# Patient Record
Sex: Female | Born: 1945 | Race: White | Hispanic: No | Marital: Married | Smoking: Former smoker
Health system: Southern US, Community
[De-identification: ages and names within clinical notes are randomized; demographics above are authoritative.]

## PROBLEM LIST (undated history)

## (undated) DIAGNOSIS — IMO0001 Reserved for inherently not codable concepts without codable children: Secondary | ICD-10-CM

## (undated) DIAGNOSIS — J449 Chronic obstructive pulmonary disease, unspecified: Secondary | ICD-10-CM

## (undated) DIAGNOSIS — I10 Essential (primary) hypertension: Secondary | ICD-10-CM

## (undated) DIAGNOSIS — K219 Gastro-esophageal reflux disease without esophagitis: Secondary | ICD-10-CM

## (undated) DIAGNOSIS — E119 Type 2 diabetes mellitus without complications: Secondary | ICD-10-CM

## (undated) DIAGNOSIS — M109 Gout, unspecified: Secondary | ICD-10-CM

## (undated) DIAGNOSIS — D649 Anemia, unspecified: Secondary | ICD-10-CM

## (undated) HISTORY — DX: Anemia, unspecified: D64.9

## (undated) HISTORY — PX: ABDOMINAL HYSTERECTOMY: SHX81

## (undated) HISTORY — PX: EXPLORATION POST OPERATIVE OPEN HEART: SHX5061

## (undated) HISTORY — DX: Essential (primary) hypertension: I10

## (undated) HISTORY — DX: Gout, unspecified: M10.9

## (undated) HISTORY — DX: Reserved for inherently not codable concepts without codable children: IMO0001

## (undated) HISTORY — DX: Chronic obstructive pulmonary disease, unspecified: J44.9

## (undated) HISTORY — DX: Type 2 diabetes mellitus without complications: E11.9

## (undated) HISTORY — DX: Gastro-esophageal reflux disease without esophagitis: K21.9

---

## 2004-08-13 ENCOUNTER — Ambulatory Visit: Payer: Self-pay | Admitting: Family Medicine

## 2004-08-24 ENCOUNTER — Emergency Department: Payer: Self-pay | Admitting: Unknown Physician Specialty

## 2008-11-02 ENCOUNTER — Encounter: Payer: Self-pay | Admitting: Specialist

## 2008-11-10 ENCOUNTER — Encounter: Payer: Self-pay | Admitting: Specialist

## 2008-12-11 ENCOUNTER — Encounter: Payer: Self-pay | Admitting: Specialist

## 2010-02-05 ENCOUNTER — Inpatient Hospital Stay: Payer: Self-pay | Admitting: Internal Medicine

## 2010-06-20 ENCOUNTER — Ambulatory Visit: Payer: Self-pay | Admitting: Anesthesiology

## 2010-06-25 ENCOUNTER — Ambulatory Visit: Payer: Self-pay | Admitting: Anesthesiology

## 2011-01-27 ENCOUNTER — Emergency Department: Payer: Self-pay | Admitting: Emergency Medicine

## 2011-01-29 ENCOUNTER — Inpatient Hospital Stay: Payer: Self-pay | Admitting: Internal Medicine

## 2011-09-11 ENCOUNTER — Emergency Department: Payer: Self-pay | Admitting: Unknown Physician Specialty

## 2011-11-02 IMAGING — CR DG CHEST 2V
1 series · 3 of 3 positions shown · non-contrast
Comparison: none

REASON FOR EXAM: sob
COMMENTS:

PROCEDURE:     DXR - DXR CHEST PA (OR AP) AND LATERAL  - February 05, 2010  [DATE]
RESULT:     A left pleural effusion is noted. The patient has had prior
CABG. There is cardiomegaly with pulmonary venous congestion. A component of
congestive heart failure should be considered.

[Series 1: view not recorded · 0.17mm/px · 3 of 3 slices shown]
[im 1/3]
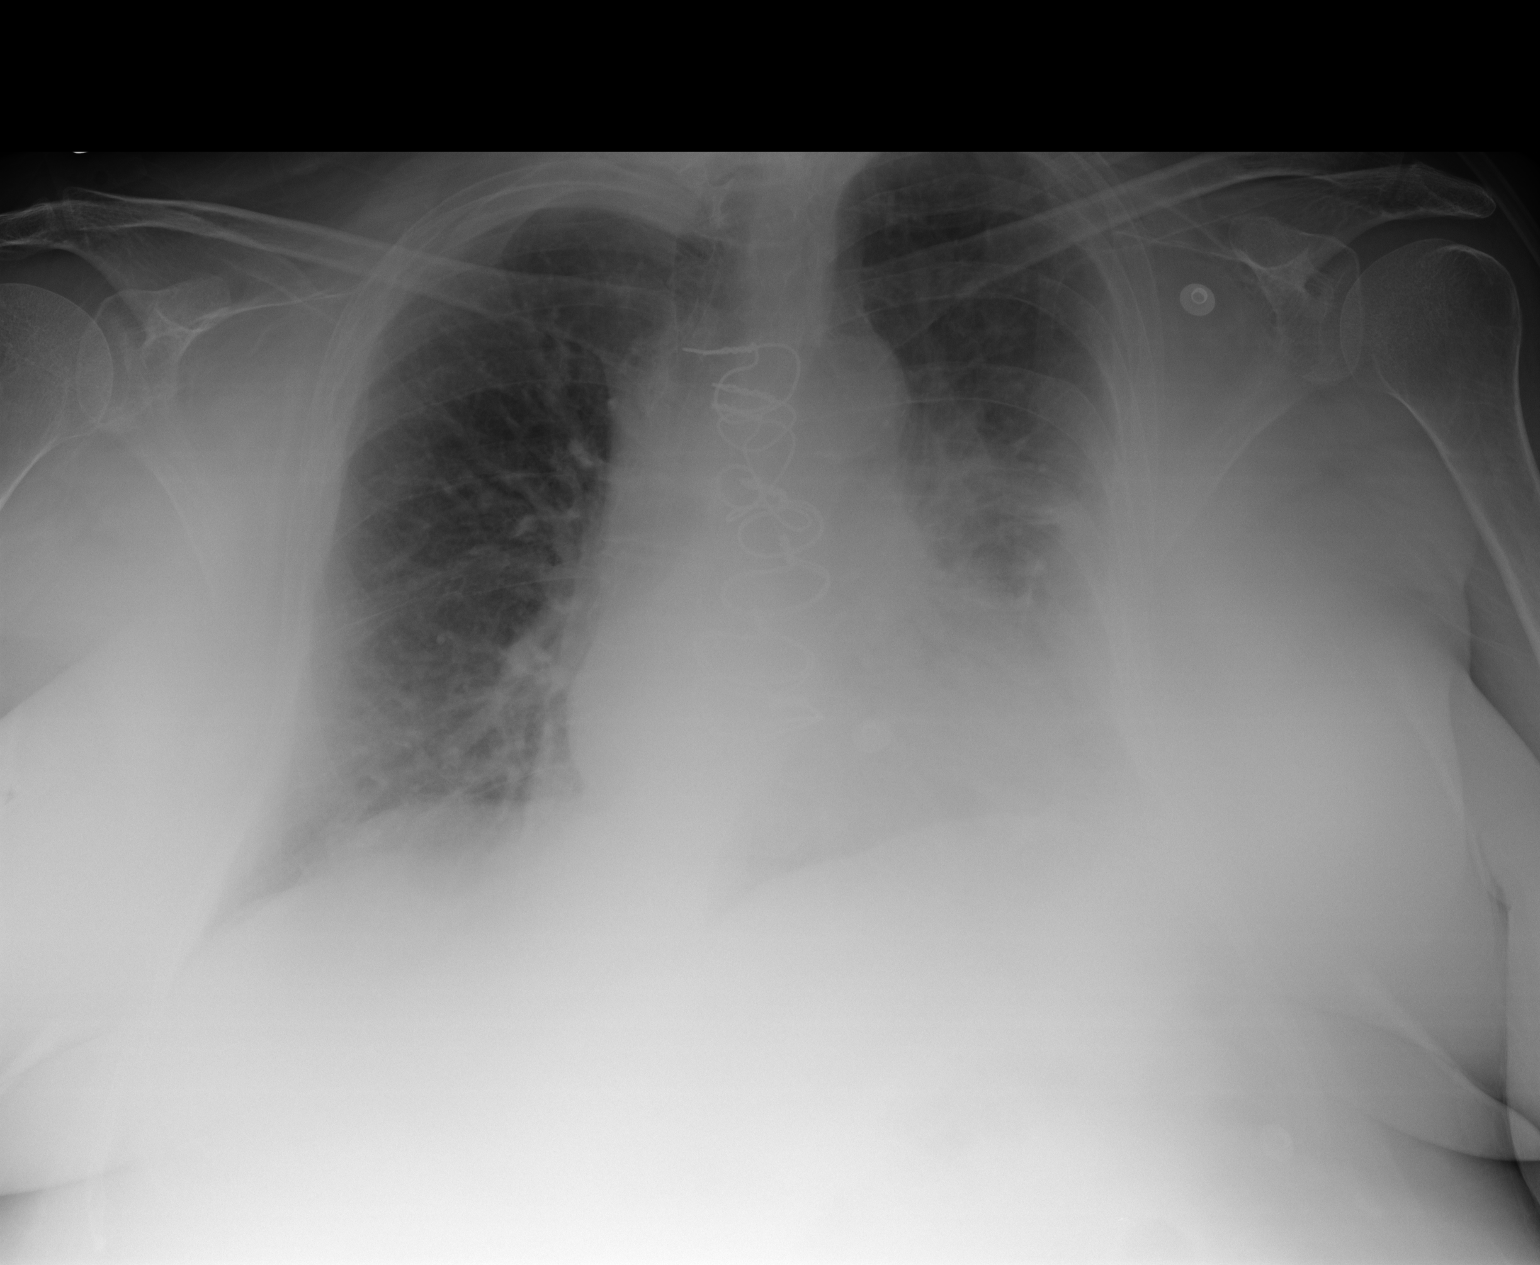
[im 2/3]
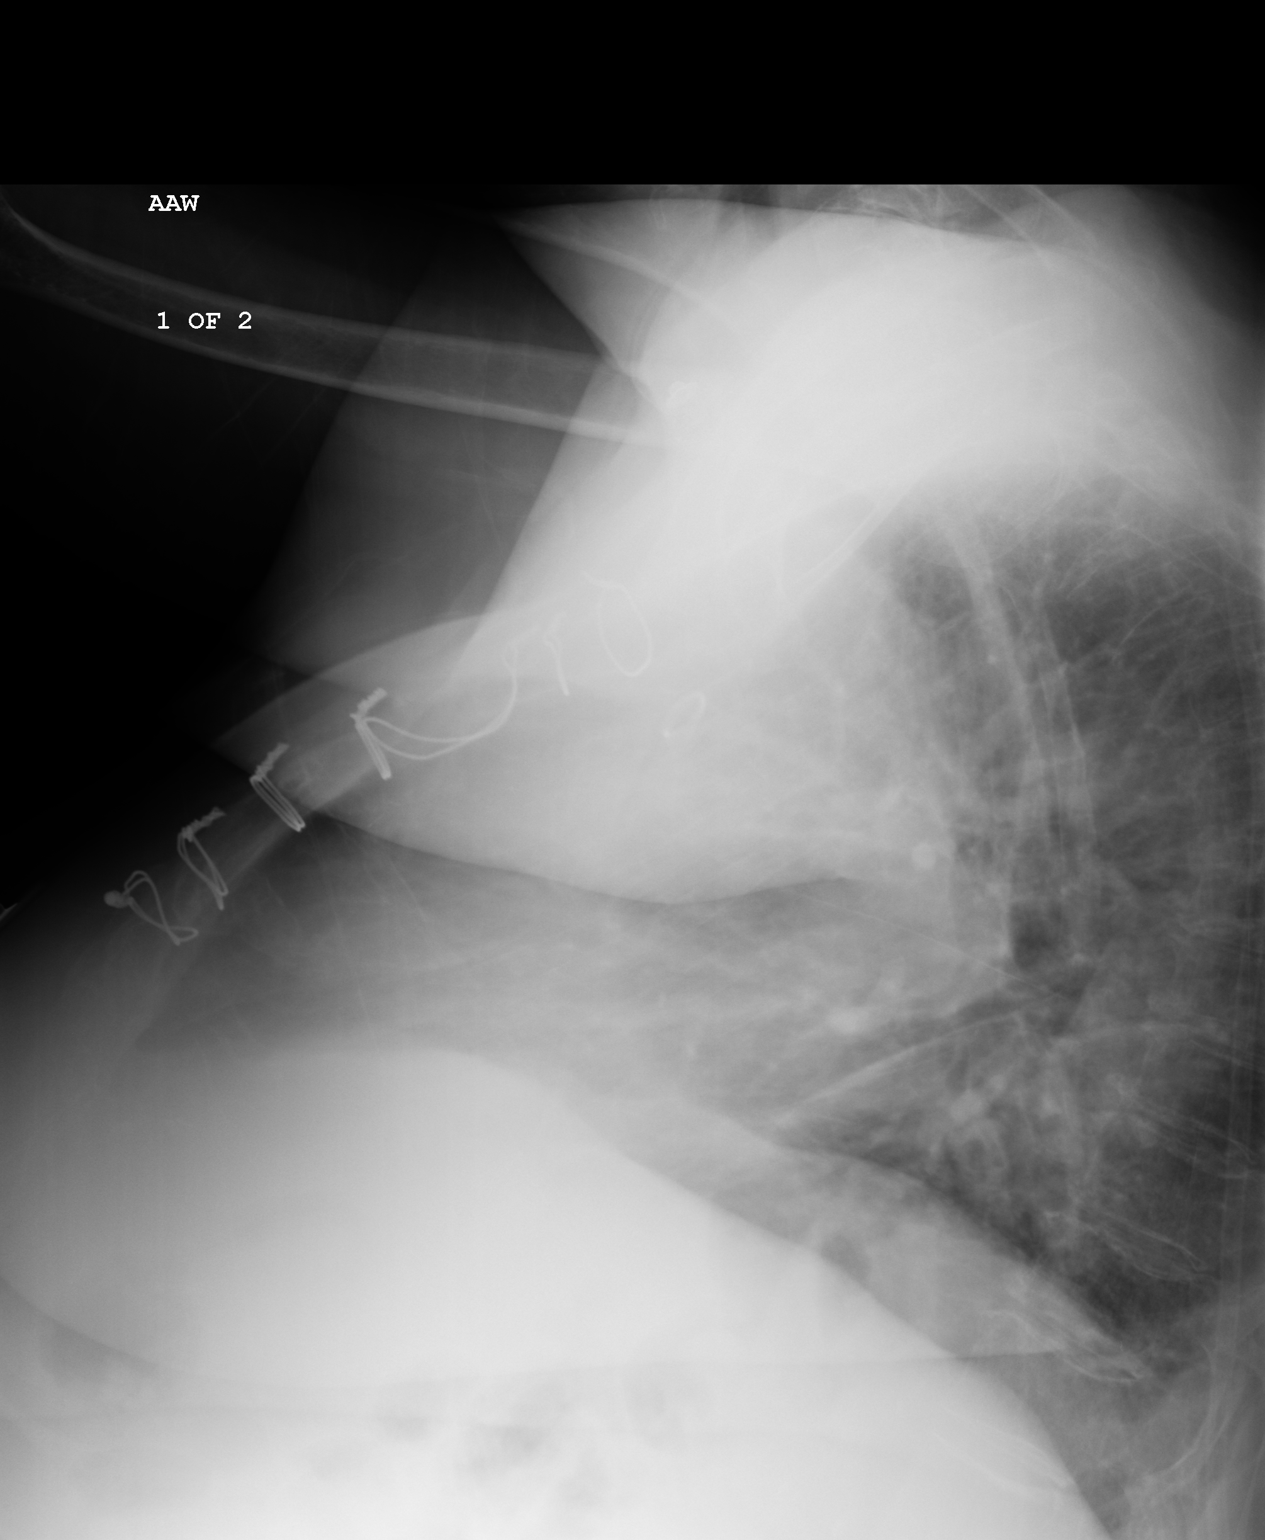
[im 3/3]
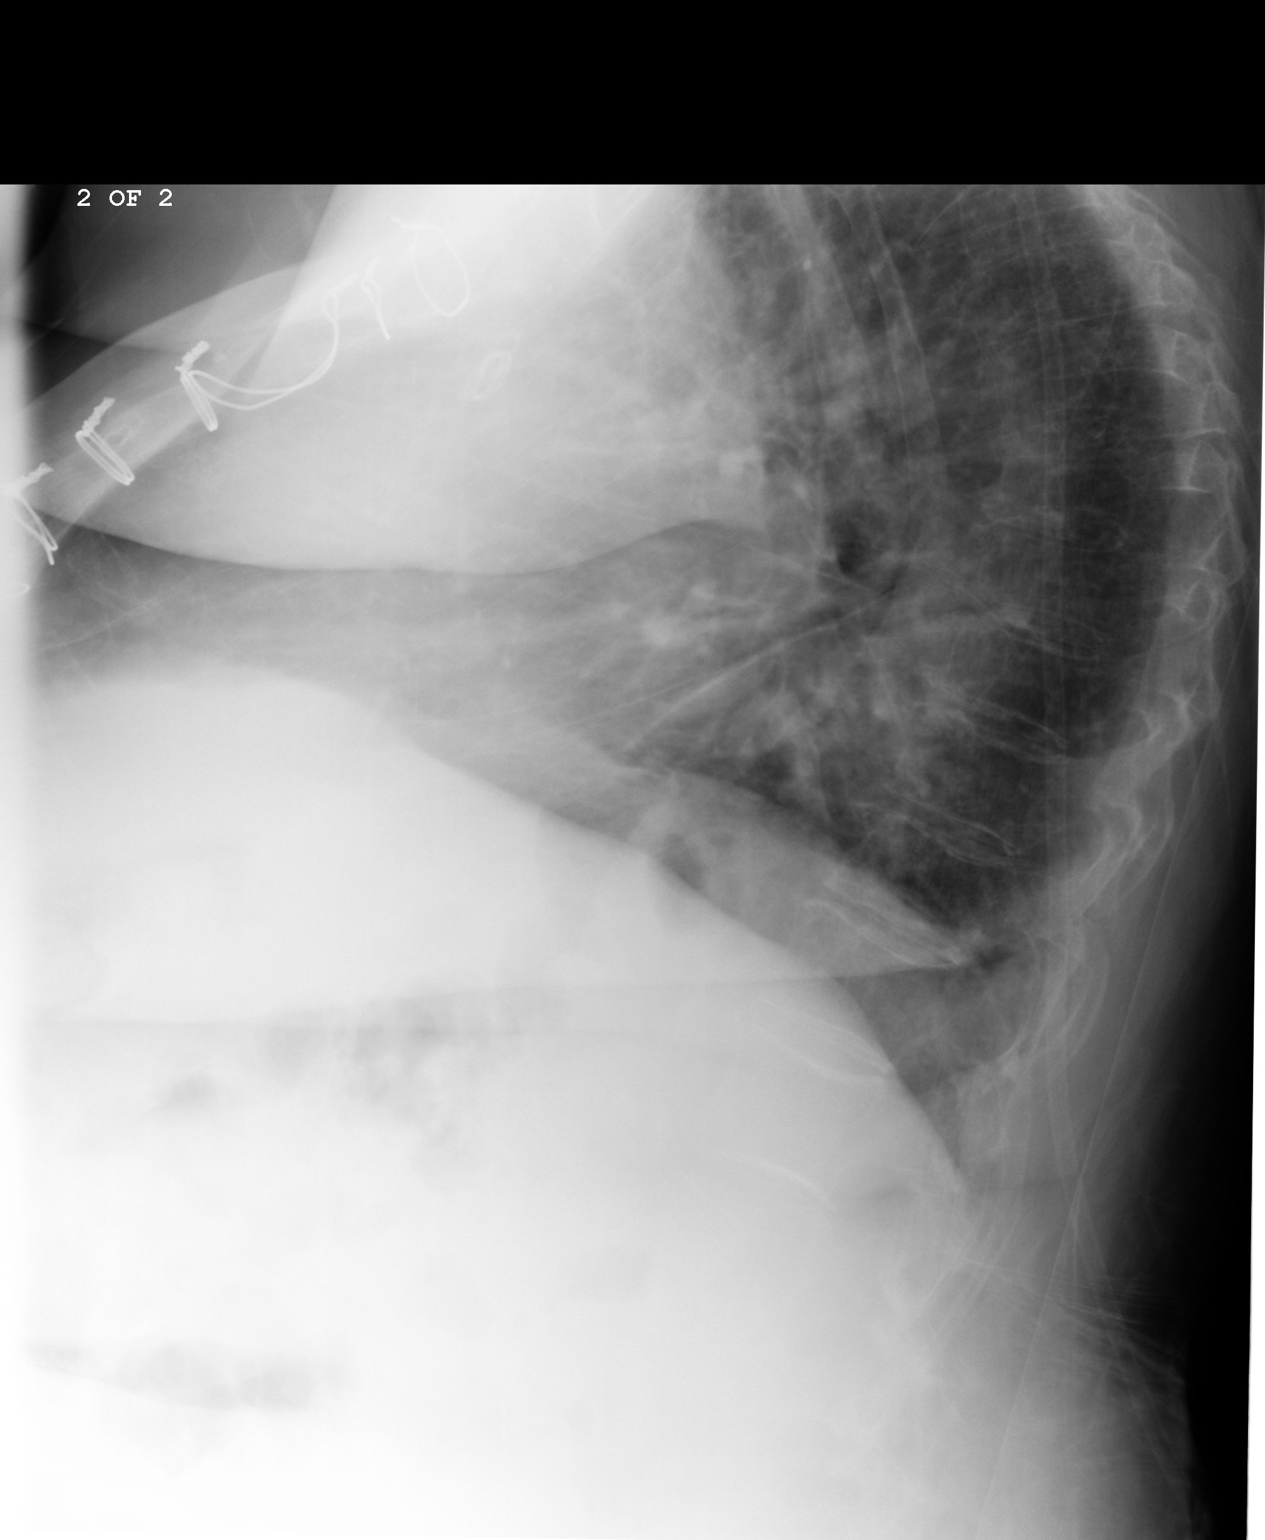

[3 of 3 positions shown; findings below may reference images not displayed]

IMPRESSION: Findings consistent with congestive heart failure, pulmonary
interstitial edema and left pleural effusion. The patient has had prior
CABG.

## 2012-05-20 ENCOUNTER — Encounter: Payer: Self-pay | Admitting: Internal Medicine

## 2012-07-04 ENCOUNTER — Inpatient Hospital Stay: Payer: Self-pay | Admitting: Internal Medicine

## 2012-07-04 LAB — CBC WITH DIFFERENTIAL/PLATELET
Eosinophil #: 0.3 10*3/uL (ref 0.0–0.7)
HCT: 29.9 % — ABNORMAL LOW (ref 35.0–47.0)
MCH: 28 pg (ref 26.0–34.0)
MCV: 89 fL (ref 80–100)
Monocyte %: 8.3 %
Neutrophil #: 7.9 10*3/uL — ABNORMAL HIGH (ref 1.4–6.5)
Neutrophil %: 81.1 %
Platelet: 289 10*3/uL (ref 150–440)
RBC: 3.37 10*6/uL — ABNORMAL LOW (ref 3.80–5.20)
RDW: 17.4 % — ABNORMAL HIGH (ref 11.5–14.5)
WBC: 9.7 10*3/uL (ref 3.6–11.0)

## 2012-07-04 LAB — BASIC METABOLIC PANEL
Anion Gap: 8 (ref 7–16)
BUN: 20 mg/dL — ABNORMAL HIGH (ref 7–18)
Chloride: 102 mmol/L (ref 98–107)
Co2: 29 mmol/L (ref 21–32)
Creatinine: 0.86 mg/dL (ref 0.60–1.30)
EGFR (African American): >60
EGFR (Non-African Amer.): >60
Glucose: 183 mg/dL — ABNORMAL HIGH (ref 65–99)
Osmolality: 285 (ref 275–301)
Sodium: 139 mmol/L (ref 136–145)

## 2012-07-04 LAB — URINALYSIS, COMPLETE
Bilirubin,UR: NEGATIVE
Blood: NEGATIVE
Ketone: NEGATIVE
Nitrite: NEGATIVE
Ph: 7 (ref 4.5–8.0)
RBC,UR: <1 /HPF (ref 0–5)
Squamous Epithelial: <1

## 2012-07-04 LAB — PRO B NATRIURETIC PEPTIDE: B-Type Natriuretic Peptide: 1137 pg/mL — ABNORMAL HIGH (ref 0–125)

## 2012-07-04 LAB — TROPONIN I: Troponin-I: 0.12 ng/mL — ABNORMAL HIGH

## 2012-07-05 DIAGNOSIS — I10 Essential (primary) hypertension: Secondary | ICD-10-CM

## 2012-07-05 DIAGNOSIS — I359 Nonrheumatic aortic valve disorder, unspecified: Secondary | ICD-10-CM

## 2012-07-05 DIAGNOSIS — R0602 Shortness of breath: Secondary | ICD-10-CM

## 2012-07-05 LAB — CBC WITH DIFFERENTIAL/PLATELET
Basophil #: 0.1 10*3/uL (ref 0.0–0.1)
Basophil %: 1.2 %
HCT: 29.5 % — ABNORMAL LOW (ref 35.0–47.0)
Monocyte %: 11.5 %
Neutrophil %: 67.3 %
Platelet: 292 10*3/uL (ref 150–440)
RBC: 3.38 10*6/uL — ABNORMAL LOW (ref 3.80–5.20)
RDW: 17.4 % — ABNORMAL HIGH (ref 11.5–14.5)
WBC: 6.3 10*3/uL (ref 3.6–11.0)

## 2012-07-05 LAB — COMPREHENSIVE METABOLIC PANEL
Albumin: 3.3 g/dL — ABNORMAL LOW (ref 3.4–5.0)
BUN: 26 mg/dL — ABNORMAL HIGH (ref 7–18)
Creatinine: 1.22 mg/dL (ref 0.60–1.30)
Glucose: 164 mg/dL — ABNORMAL HIGH (ref 65–99)
Potassium: 3.6 mmol/L (ref 3.5–5.1)
SGPT (ALT): 15 U/L (ref 12–78)

## 2012-07-06 LAB — BASIC METABOLIC PANEL
Anion Gap: 9 (ref 7–16)
Calcium, Total: 9.9 mg/dL (ref 8.5–10.1)
Chloride: 91 mmol/L — ABNORMAL LOW (ref 98–107)
Co2: 31 mmol/L (ref 21–32)
Glucose: 289 mg/dL — ABNORMAL HIGH (ref 65–99)
Osmolality: 283 (ref 275–301)

## 2012-07-07 DIAGNOSIS — I509 Heart failure, unspecified: Secondary | ICD-10-CM

## 2012-07-07 DIAGNOSIS — I359 Nonrheumatic aortic valve disorder, unspecified: Secondary | ICD-10-CM

## 2012-07-07 LAB — BASIC METABOLIC PANEL
Anion Gap: 6 — ABNORMAL LOW (ref 7–16)
BUN: 66 mg/dL — ABNORMAL HIGH (ref 7–18)
Calcium, Total: 9 mg/dL (ref 8.5–10.1)
Chloride: 91 mmol/L — ABNORMAL LOW (ref 98–107)
Creatinine: 2.04 mg/dL — ABNORMAL HIGH (ref 0.60–1.30)
Creatinine: 1.99 mg/dL — ABNORMAL HIGH (ref 0.60–1.30)
EGFR (Non-African Amer.): 25 — ABNORMAL LOW
Osmolality: 284 (ref 275–301)
Osmolality: 292 (ref 275–301)

## 2012-07-07 LAB — CBC WITH DIFFERENTIAL/PLATELET
Basophil #: 0 10*3/uL (ref 0.0–0.1)
Basophil %: 0.4 %
Eosinophil #: 0 10*3/uL (ref 0.0–0.7)
Eosinophil %: 0 %
HCT: 28.8 % — ABNORMAL LOW (ref 35.0–47.0)
Lymphocyte #: 0.6 10*3/uL — ABNORMAL LOW (ref 1.0–3.6)
MCH: 28.1 pg (ref 26.0–34.0)
MCHC: 32.4 g/dL (ref 32.0–36.0)
MCV: 87 fL (ref 80–100)
Monocyte #: 0.6 x10 3/mm (ref 0.2–0.9)
Neutrophil #: 9.3 10*3/uL — ABNORMAL HIGH (ref 1.4–6.5)
Neutrophil %: 87.9 %
RBC: 3.32 10*6/uL — ABNORMAL LOW (ref 3.80–5.20)
RDW: 17.7 % — ABNORMAL HIGH (ref 11.5–14.5)
WBC: 10.6 10*3/uL (ref 3.6–11.0)

## 2012-07-08 LAB — BASIC METABOLIC PANEL
BUN: 59 mg/dL — ABNORMAL HIGH (ref 7–18)
EGFR (African American): 43 — ABNORMAL LOW
EGFR (Non-African Amer.): 37 — ABNORMAL LOW
Glucose: 183 mg/dL — ABNORMAL HIGH (ref 65–99)
Potassium: 4.6 mmol/L (ref 3.5–5.1)
Sodium: 138 mmol/L (ref 136–145)

## 2012-07-09 LAB — BASIC METABOLIC PANEL
Anion Gap: 2 — ABNORMAL LOW (ref 7–16)
BUN: 43 mg/dL — ABNORMAL HIGH (ref 7–18)
Chloride: 101 mmol/L (ref 98–107)
Co2: 34 mmol/L — ABNORMAL HIGH (ref 21–32)
Creatinine: 1.26 mg/dL (ref 0.60–1.30)
Glucose: 199 mg/dL — ABNORMAL HIGH (ref 65–99)
Potassium: 4.9 mmol/L (ref 3.5–5.1)

## 2012-07-09 LAB — CULTURE, BLOOD (SINGLE)

## 2012-07-16 ENCOUNTER — Encounter: Payer: Medicare Other | Admitting: Cardiovascular Disease

## 2012-10-23 IMAGING — CR DG KNEE 1-2V*R*
1 series · 2 of 2 positions shown · non-contrast
Comparison: none

REASON FOR EXAM: fall, landed on right knee - knee pain
COMMENTS:

PROCEDURE:     DXR - DXR KNEE RIGHT AP AND LATERAL  - January 27, 2011  [DATE]
RESULT:     There is no evidence of fracture, dislocation, or malalignment.

[Series 1: view not recorded · 0.17mm/px · 2 of 2 slices shown]
[im 1/2]
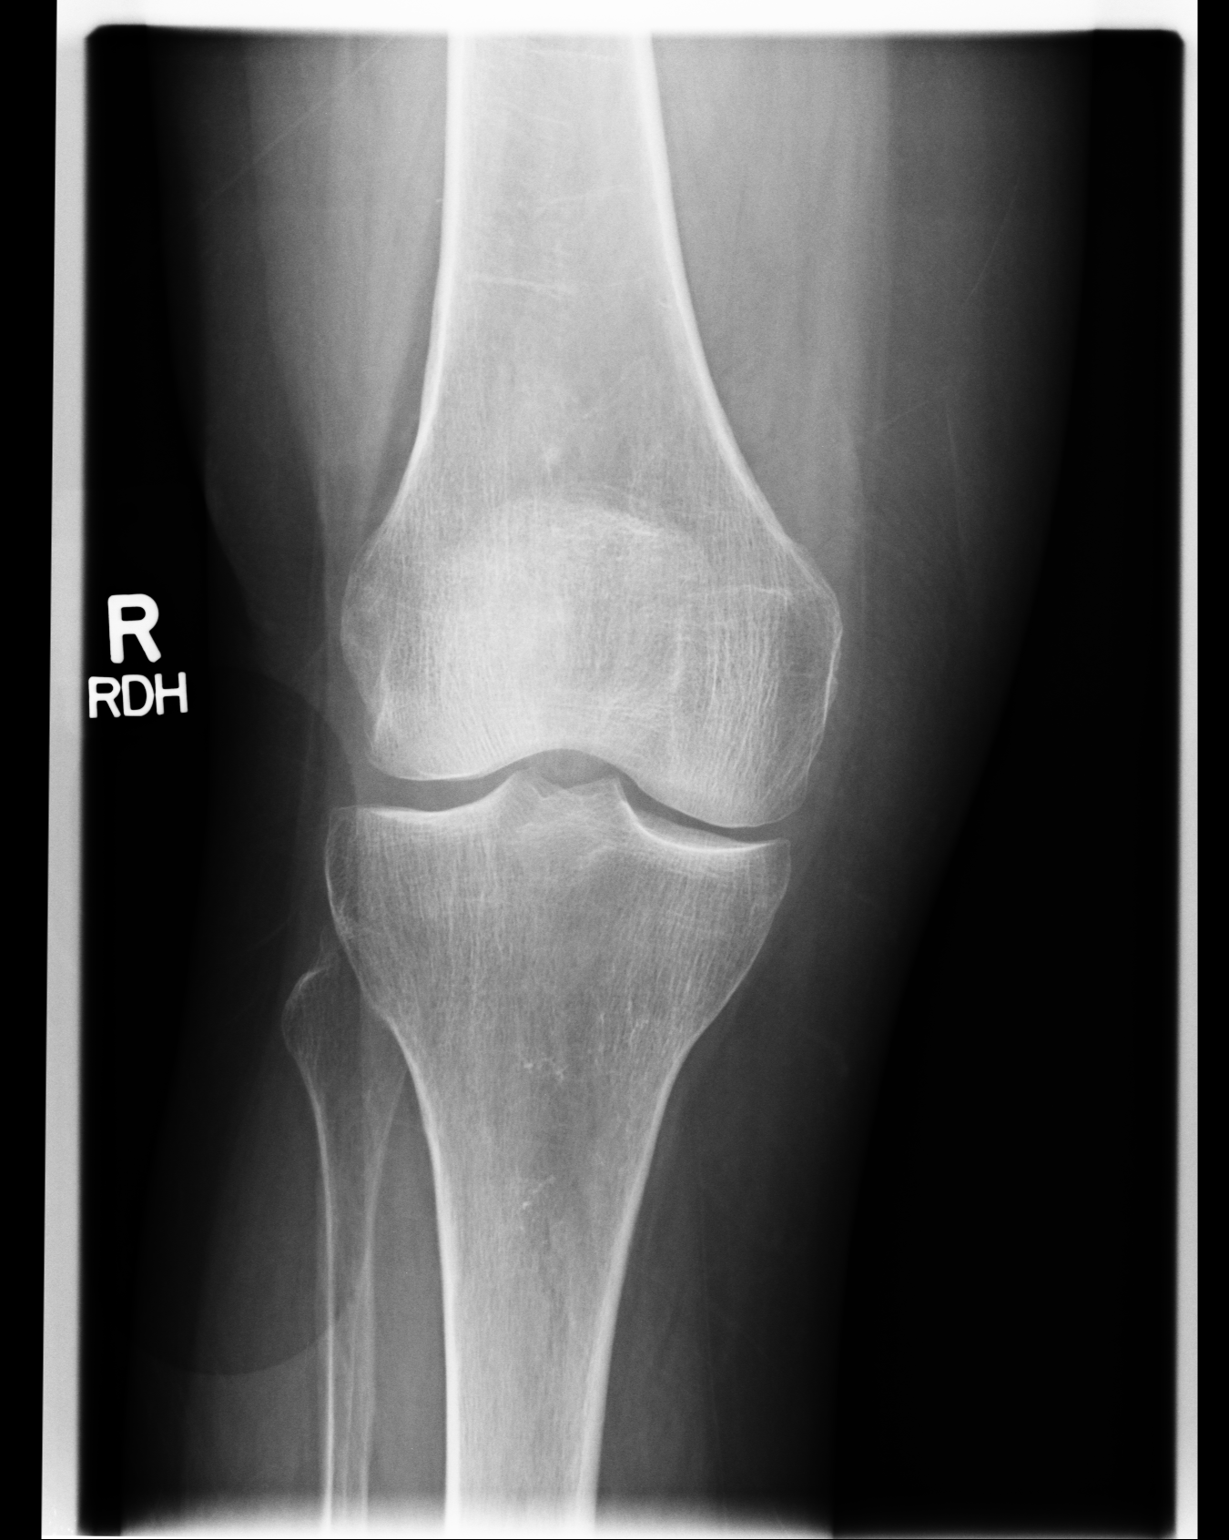
[im 2/2]
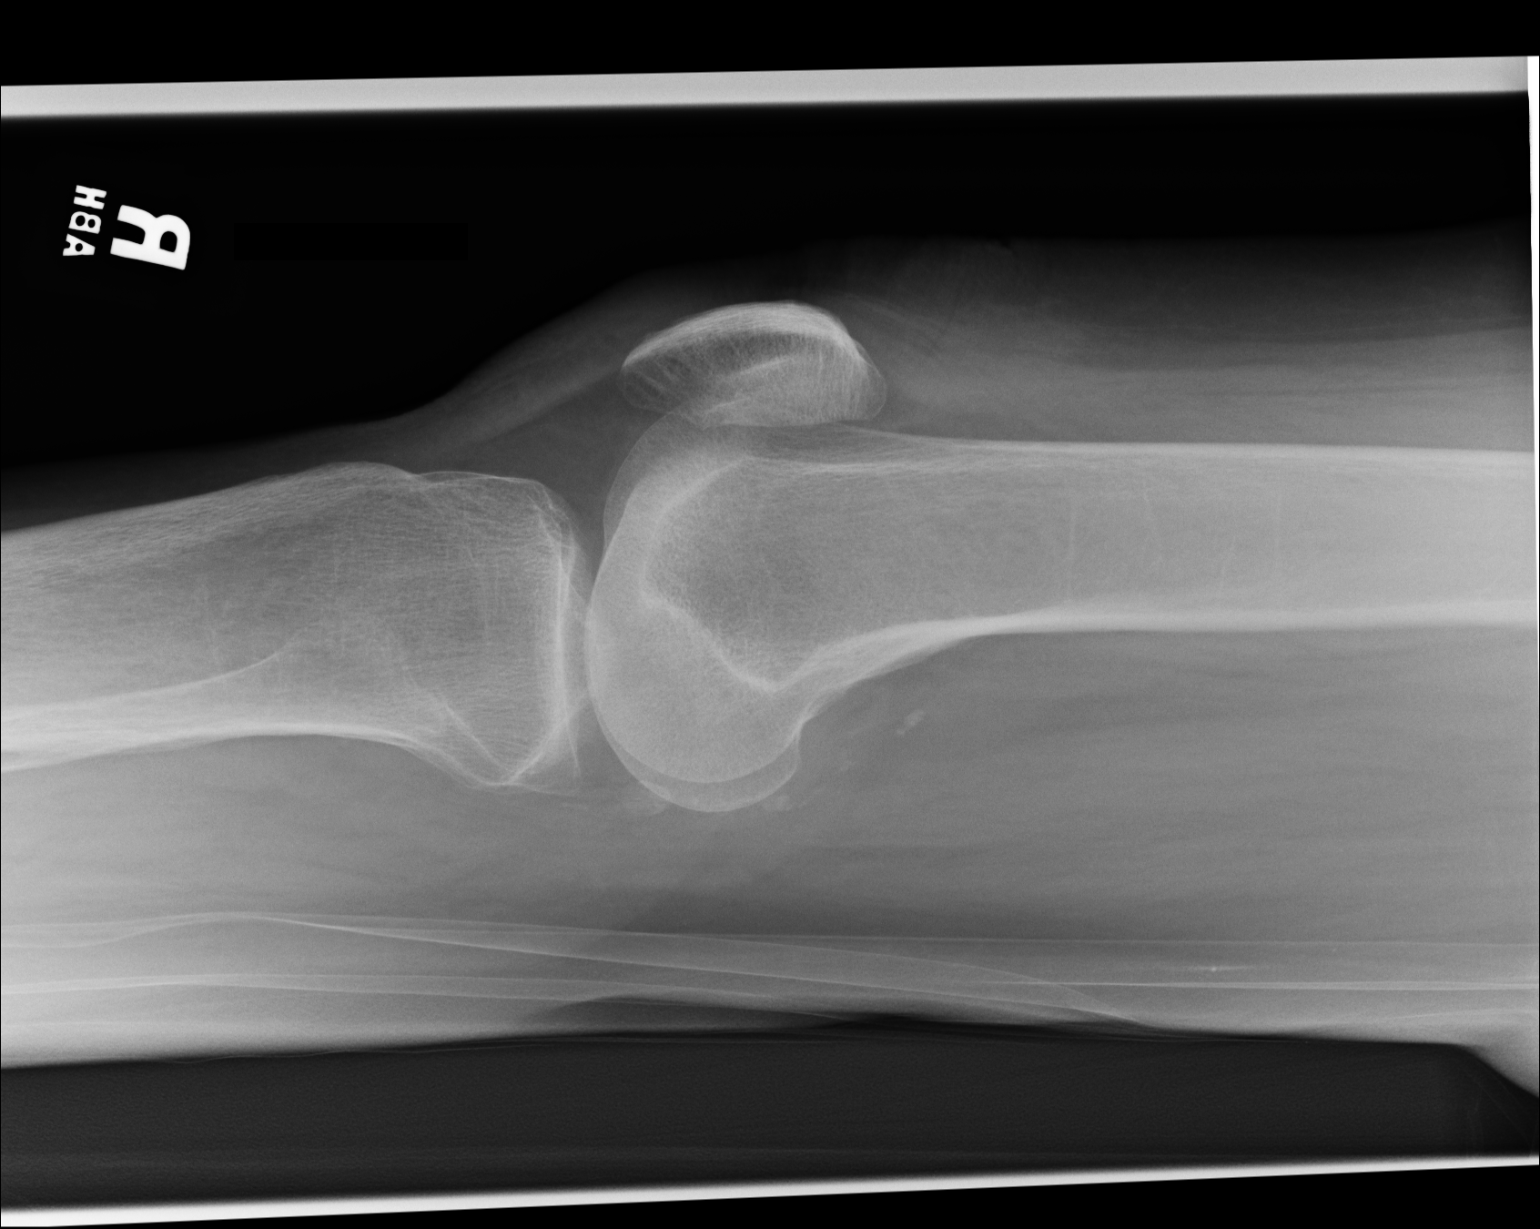

[2 of 2 positions shown; findings below may reference images not displayed]

IMPRESSION: 1. No evidence of acute abnormalities.
2. If there are persistent complaints of pain or persistent clinical
concern, a repeat evaluation in 7-10 days is recommended if clinically
warranted.

## 2013-05-07 ENCOUNTER — Encounter: Payer: Self-pay | Admitting: *Deleted

## 2013-05-10 ENCOUNTER — Encounter: Payer: Self-pay | Admitting: Podiatry

## 2013-05-10 ENCOUNTER — Ambulatory Visit (INDEPENDENT_AMBULATORY_CARE_PROVIDER_SITE_OTHER): Payer: Medicare Other | Admitting: Podiatry

## 2013-05-10 VITALS — BP 102/64 | HR 65 | Resp 20

## 2013-05-10 DIAGNOSIS — M79609 Pain in unspecified limb: Secondary | ICD-10-CM

## 2013-05-10 DIAGNOSIS — B351 Tinea unguium: Secondary | ICD-10-CM

## 2013-05-10 NOTE — Progress Notes (Signed)
She presents today with a chief complaint of painful toenails one through 5 bilateral.  Objective: Pulses are palpable bilateral. Nails are thick yellow dystrophic with mycotic and painful palpation.  Assessment: Pain in limb secondary to onychomycosis.  Plan: Debridement nails 1-5 bilateral covered service secondary to pain.

## 2013-08-09 ENCOUNTER — Ambulatory Visit: Payer: Medicare Other | Admitting: Podiatry

## 2013-08-09 LAB — COMPREHENSIVE METABOLIC PANEL
ALK PHOS: 160 U/L — AB
ALT: 17 U/L (ref 12–78)
ANION GAP: 2 — AB (ref 7–16)
AST: 23 U/L (ref 15–37)
Albumin: 3.4 g/dL (ref 3.4–5.0)
BILIRUBIN TOTAL: 0.4 mg/dL (ref 0.2–1.0)
BUN: 22 mg/dL — AB (ref 7–18)
CHLORIDE: 101 mmol/L (ref 98–107)
CREATININE: 1.29 mg/dL (ref 0.60–1.30)
Calcium, Total: 9.1 mg/dL (ref 8.5–10.1)
Co2: 31 mmol/L (ref 21–32)
EGFR (African American): 49 — ABNORMAL LOW
EGFR (Non-African Amer.): 43 — ABNORMAL LOW
Glucose: 102 mg/dL — ABNORMAL HIGH (ref 65–99)
Osmolality: 272 (ref 275–301)
POTASSIUM: 3.8 mmol/L (ref 3.5–5.1)
Sodium: 134 mmol/L — ABNORMAL LOW (ref 136–145)
Total Protein: 7.6 g/dL (ref 6.4–8.2)

## 2013-08-09 LAB — CBC WITH DIFFERENTIAL/PLATELET
BASOS ABS: 0 10*3/uL (ref 0.0–0.1)
Basophil %: 1.1 %
EOS ABS: 0.1 10*3/uL (ref 0.0–0.7)
EOS PCT: 1.4 %
HCT: 31.9 % — ABNORMAL LOW (ref 35.0–47.0)
HGB: 10.1 g/dL — ABNORMAL LOW (ref 12.0–16.0)
LYMPHS ABS: 0.5 10*3/uL — AB (ref 1.0–3.6)
LYMPHS PCT: 13.2 %
MCH: 27 pg (ref 26.0–34.0)
MCHC: 31.8 g/dL — AB (ref 32.0–36.0)
MCV: 85 fL (ref 80–100)
MONOS PCT: 19.8 %
Monocyte #: 0.8 x10 3/mm (ref 0.2–0.9)
NEUTROS ABS: 2.7 10*3/uL (ref 1.4–6.5)
Neutrophil %: 64.5 %
Platelet: 259 10*3/uL (ref 150–440)
RBC: 3.76 10*6/uL — ABNORMAL LOW (ref 3.80–5.20)
RDW: 16.6 % — ABNORMAL HIGH (ref 11.5–14.5)
WBC: 4.1 10*3/uL (ref 3.6–11.0)

## 2013-08-09 LAB — TROPONIN I
TROPONIN-I: 0.18 ng/mL — AB
Troponin-I: 0.19 ng/mL — ABNORMAL HIGH
Troponin-I: 0.16 ng/mL — ABNORMAL HIGH

## 2013-08-09 LAB — PRO B NATRIURETIC PEPTIDE: B-Type Natriuretic Peptide: 847 pg/mL — ABNORMAL HIGH (ref 0–125)

## 2013-08-10 ENCOUNTER — Inpatient Hospital Stay: Payer: Self-pay | Admitting: Internal Medicine

## 2013-08-10 LAB — CBC WITH DIFFERENTIAL/PLATELET
Basophil #: 0 10*3/uL (ref 0.0–0.1)
Basophil %: 0.2 %
EOS ABS: 0 10*3/uL (ref 0.0–0.7)
Eosinophil %: 0 %
HCT: 28.4 % — ABNORMAL LOW (ref 35.0–47.0)
HGB: 9.1 g/dL — ABNORMAL LOW (ref 12.0–16.0)
LYMPHS ABS: 0.2 10*3/uL — AB (ref 1.0–3.6)
LYMPHS PCT: 2.5 %
MCH: 27.2 pg (ref 26.0–34.0)
MCHC: 32.2 g/dL (ref 32.0–36.0)
MCV: 84 fL (ref 80–100)
Monocyte #: 0.4 x10 3/mm (ref 0.2–0.9)
Monocyte %: 5.5 %
Neutrophil #: 7.1 10*3/uL — ABNORMAL HIGH (ref 1.4–6.5)
Neutrophil %: 91.8 %
Platelet: 257 10*3/uL (ref 150–440)
RBC: 3.36 10*6/uL — AB (ref 3.80–5.20)
RDW: 16.4 % — AB (ref 11.5–14.5)
WBC: 7.8 10*3/uL (ref 3.6–11.0)

## 2013-08-10 LAB — BASIC METABOLIC PANEL
Anion Gap: 2 — ABNORMAL LOW (ref 7–16)
BUN: 22 mg/dL — AB (ref 7–18)
CALCIUM: 8.8 mg/dL (ref 8.5–10.1)
CO2: 30 mmol/L (ref 21–32)
Chloride: 102 mmol/L (ref 98–107)
Creatinine: 1.22 mg/dL (ref 0.60–1.30)
EGFR (African American): 53 — ABNORMAL LOW
EGFR (Non-African Amer.): 45 — ABNORMAL LOW
GLUCOSE: 111 mg/dL — AB (ref 65–99)
OSMOLALITY: 272 (ref 275–301)
Potassium: 4 mmol/L (ref 3.5–5.1)
SODIUM: 134 mmol/L — AB (ref 136–145)

## 2013-09-06 ENCOUNTER — Ambulatory Visit: Payer: Medicare Other | Admitting: Podiatry

## 2013-09-08 ENCOUNTER — Encounter: Payer: Self-pay | Admitting: Podiatry

## 2013-09-08 ENCOUNTER — Ambulatory Visit (INDEPENDENT_AMBULATORY_CARE_PROVIDER_SITE_OTHER): Payer: Medicare Other | Admitting: Podiatry

## 2013-09-08 VITALS — BP 118/68 | HR 72 | Resp 16

## 2013-09-08 DIAGNOSIS — M79609 Pain in unspecified limb: Secondary | ICD-10-CM

## 2013-09-08 DIAGNOSIS — B351 Tinea unguium: Secondary | ICD-10-CM

## 2013-09-08 NOTE — Progress Notes (Signed)
She presents today with a chief complaint of painful elongated toenails one through 5 bilateral.  Objective: Vital signs are stable alert oriented x3 pulses are palpable bilateral. Nails are thick yellow dystrophic with mycotic painful palpation and elongated.  Assessment: Pain in limb secondary to onychomycosis 1 through 5 bilateral.  Plan: Debridement of nails 1 through 5 bilateral covered service secondary to pain.

## 2013-12-08 ENCOUNTER — Ambulatory Visit (INDEPENDENT_AMBULATORY_CARE_PROVIDER_SITE_OTHER): Payer: Medicare Other | Admitting: Podiatry

## 2013-12-08 DIAGNOSIS — M79676 Pain in unspecified toe(s): Secondary | ICD-10-CM

## 2013-12-08 DIAGNOSIS — M79609 Pain in unspecified limb: Secondary | ICD-10-CM

## 2013-12-08 DIAGNOSIS — B351 Tinea unguium: Secondary | ICD-10-CM

## 2013-12-08 NOTE — Progress Notes (Signed)
She presents today chief complaint of painful elongated toenails.  Objective: Pulses are strongly palpable bilateral. Nails are thick yellow dystrophic with mycotic and painful palpation. Reactive hyperkeratosis sub-first metatarsophalangeal joint of the right foot does not appear to be ulcerated as of yet.  Assessment: Diabetes mellitus pre-ulcerative lesion sub-first metatarsophalangeal joint of the right foot. Pain in limb secondary to onychomycosis 1 through 5 bilateral.  Plan: Debridement of reactive hyperkeratosis without iatrogenic lesions and debridement of nails 1 through 5 bilateral covered service secondary to pain.

## 2014-03-09 ENCOUNTER — Ambulatory Visit (INDEPENDENT_AMBULATORY_CARE_PROVIDER_SITE_OTHER): Payer: Medicare Other | Admitting: Podiatry

## 2014-03-09 DIAGNOSIS — B351 Tinea unguium: Secondary | ICD-10-CM

## 2014-03-09 DIAGNOSIS — M79676 Pain in unspecified toe(s): Secondary | ICD-10-CM

## 2014-03-09 NOTE — Progress Notes (Signed)
Presents today chief complaint of painful elongated toenails.  Objective: Pulses are palpable bilateral nails are thick, yellow dystrophic onychomycosis and painful palpation.   Assessment: Onychomycosis with pain in limb.  Plan: Treatment of nails in thickness and length as covered service secondary to pain.  

## 2014-04-05 IMAGING — CR DG CHEST 2V
1 series · 3 of 3 positions shown · non-contrast
Comparison: none

REASON FOR EXAM: hypoxia
COMMENTS:

PROCEDURE:     DXR - DXR CHEST PA (OR AP) AND LATERAL  - July 09, 2012  [DATE]
RESULT:     Comparison: 07/06/2012

[Series 1: x chest ap · 0.14mm/px · 3 of 3 slices shown]
[im 1/3]
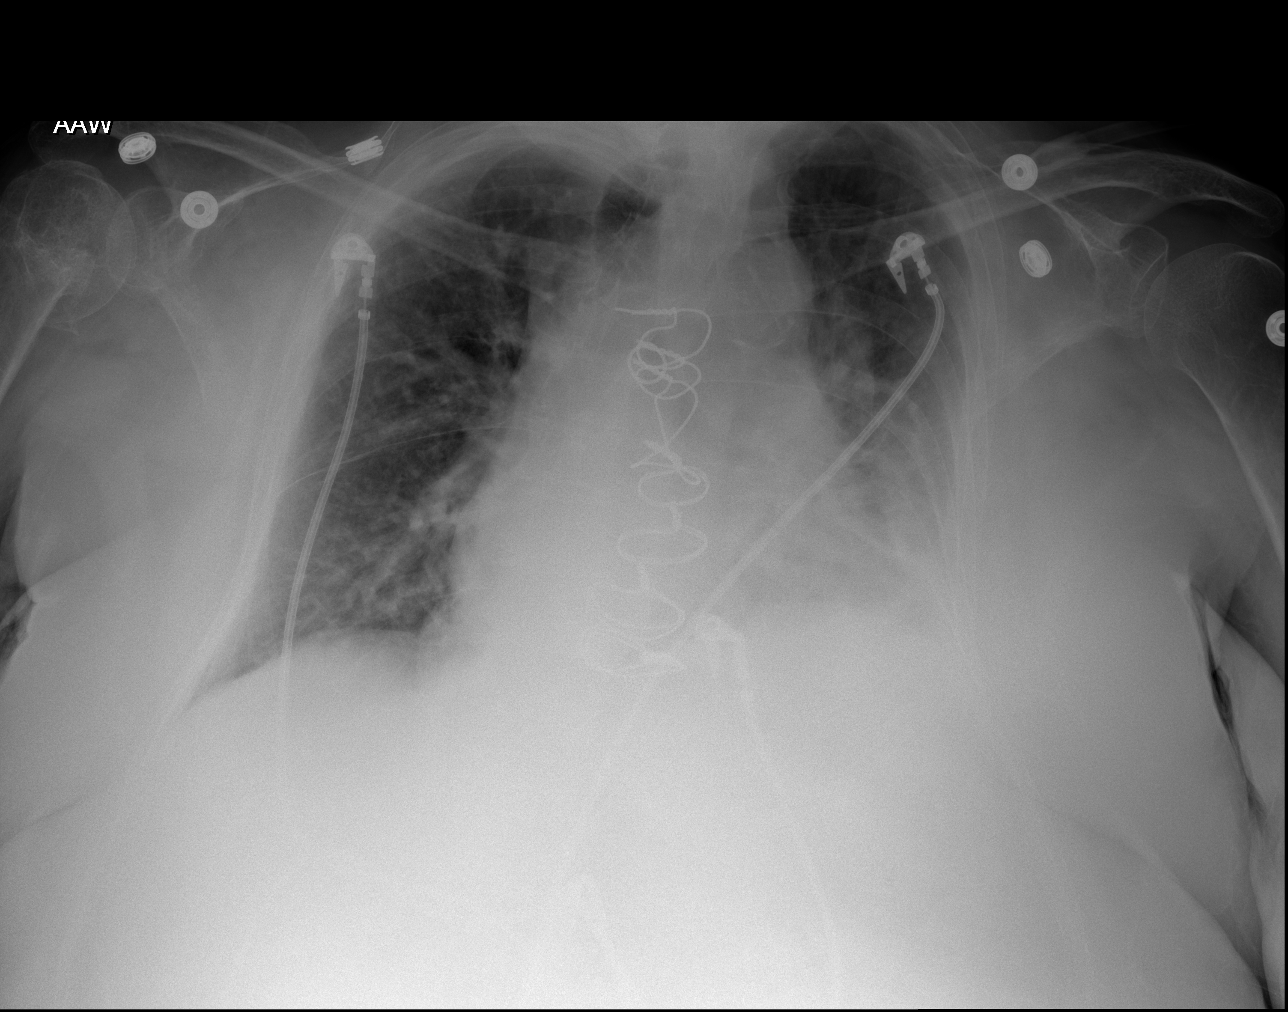
[im 2/3]
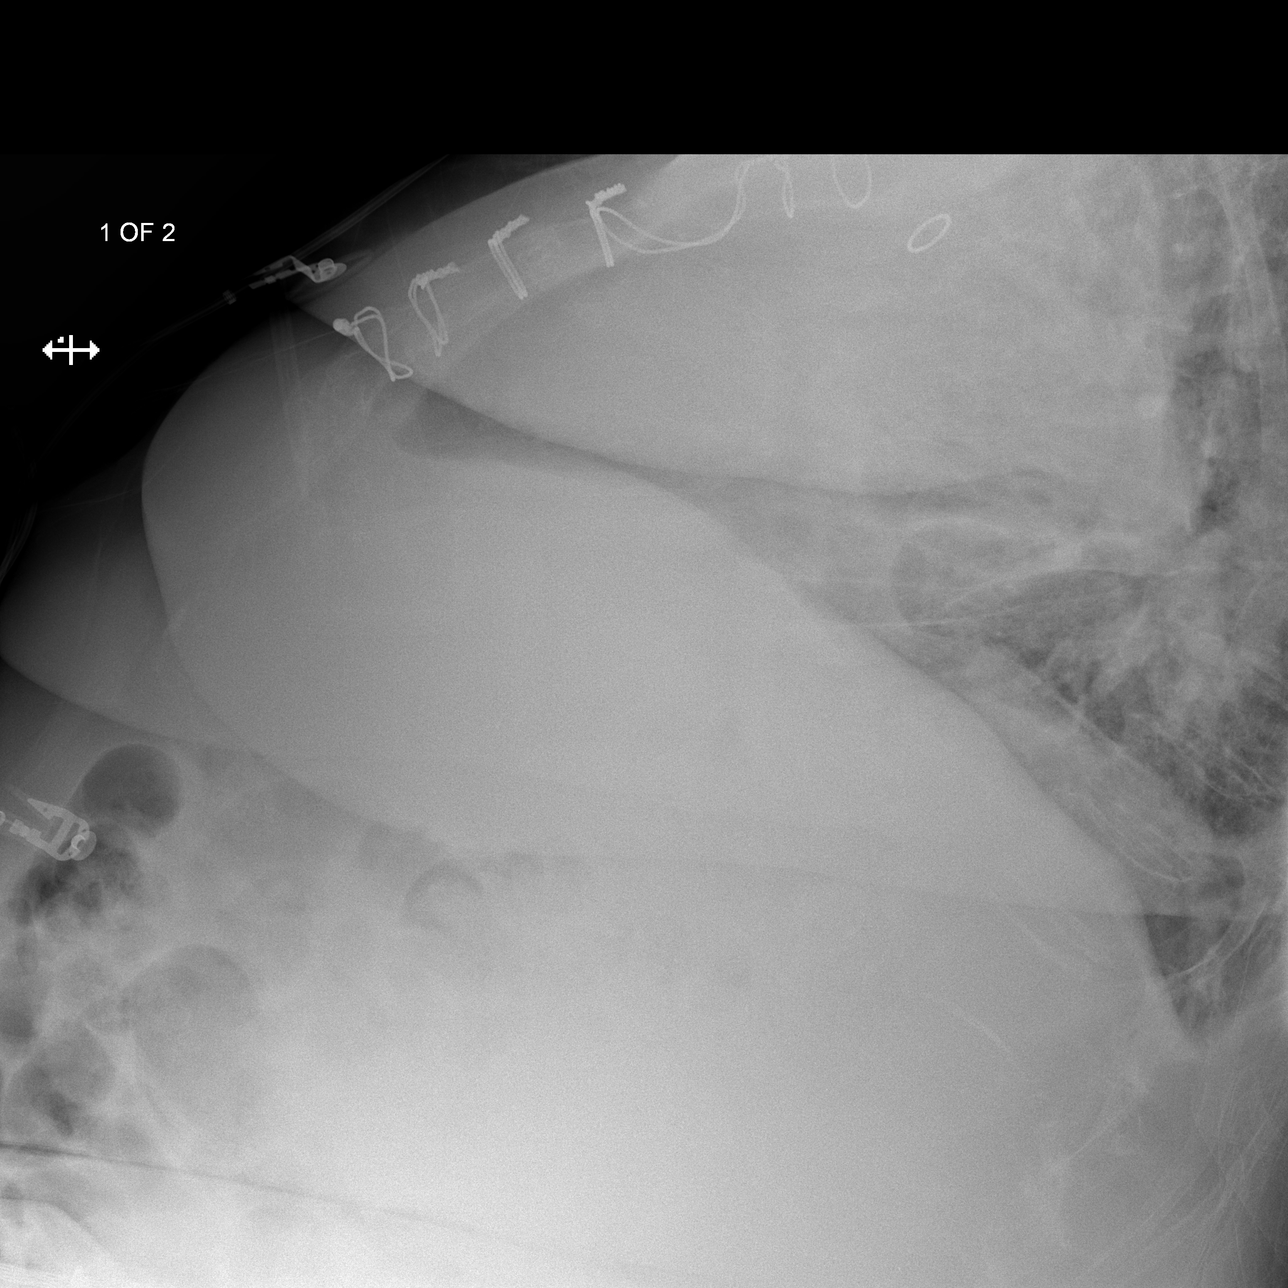
[im 3/3]
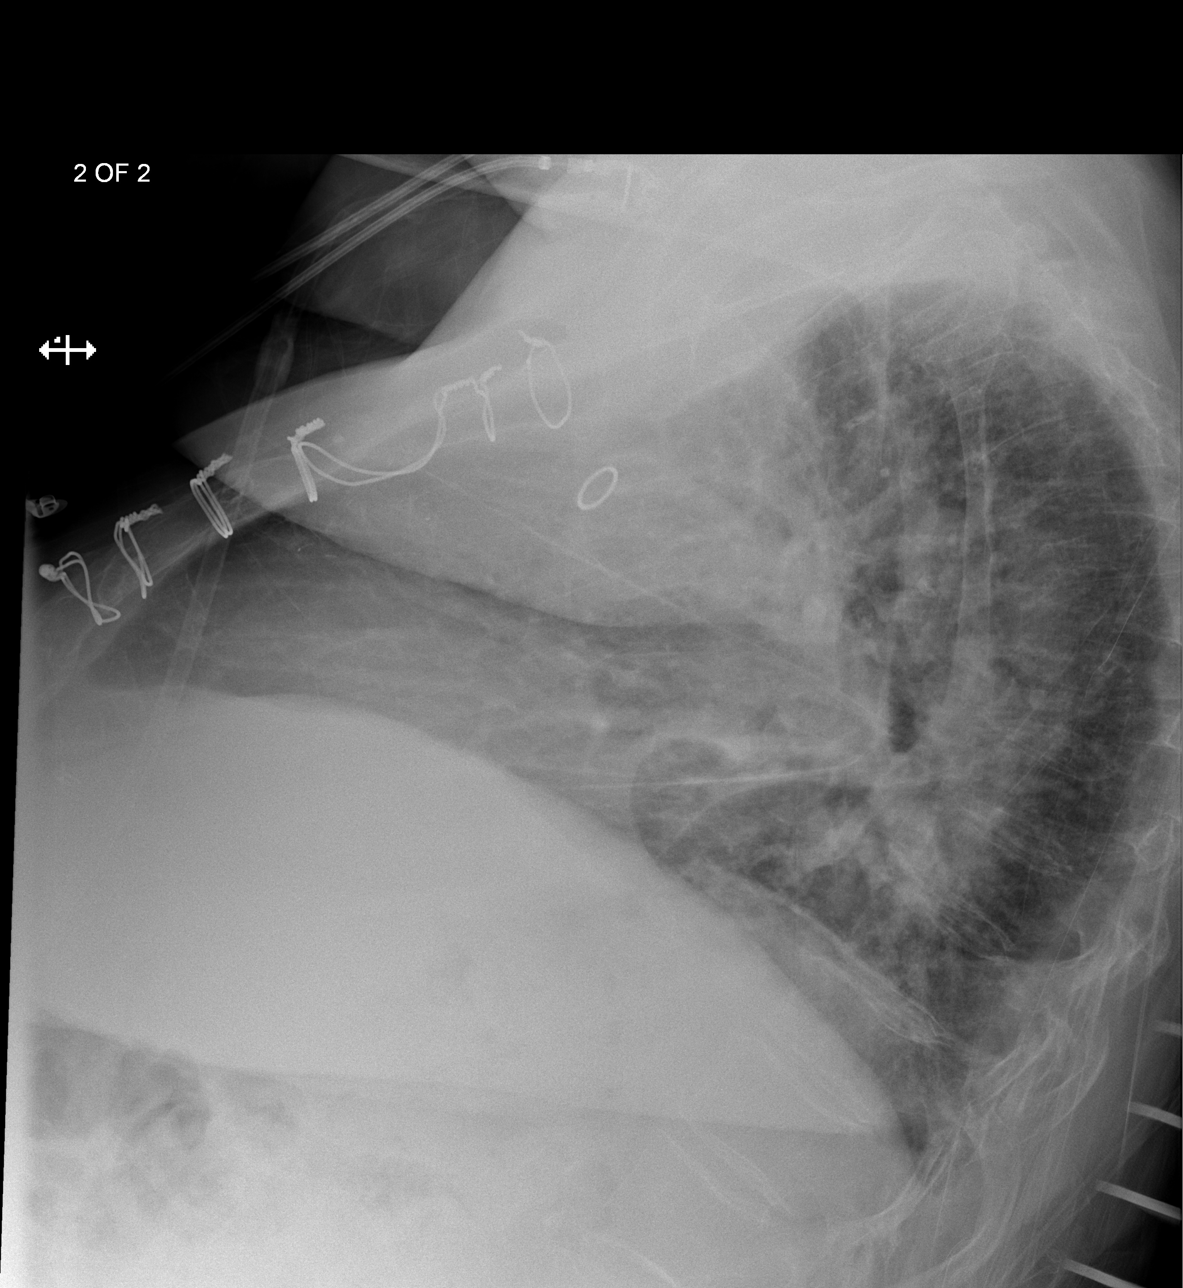

[3 of 3 positions shown; findings below may reference images not displayed]

FINDINGS: PA and lateral chest radiographs are provided. There is bilateral diffuse
interstitial thickening likely representing interstitial edema versus
interstitial pneumonitis secondary to an infectious or inflammatory
etiology. There is no focal parenchymal opacity, pleural effusion, or
pneumothorax. The heart and mediastinum are unremarkable. Prior CABG. The
osseous structures are unremarkable.
IMPRESSION: There is bilateral diffuse interstitial thickening likely representing
interstitial edema versus interstitial pneumonitis secondary to an
infectious or inflammatory etiology.

[REDACTED]

## 2014-04-05 IMAGING — CT CT CHEST W/O CM
1 series · 15 of 31 positions shown, 19 images · non-contrast
Comparison: none

REASON FOR EXAM: sob, hypoxia, suspect pulm fibrosis (cxr positive
interstial changes)
COMMENTS:

[Series 2: soft tissue · axial · 0.72mm/px · z∈[-320,-62]mm · 15 of 94 slices shown, 19 images]
[im 4/94  mediastinal]
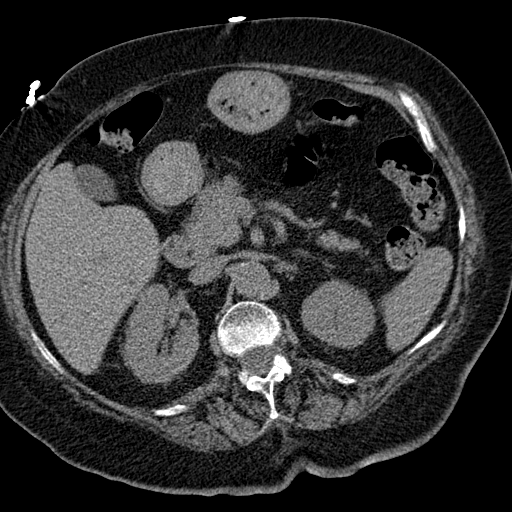
[im 4/94  lung]
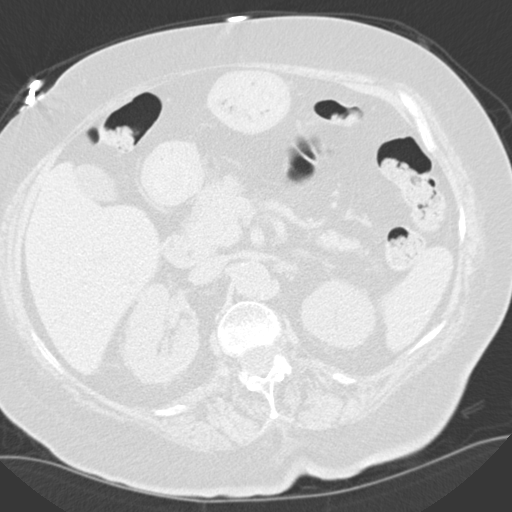
[im 11/94  lung]
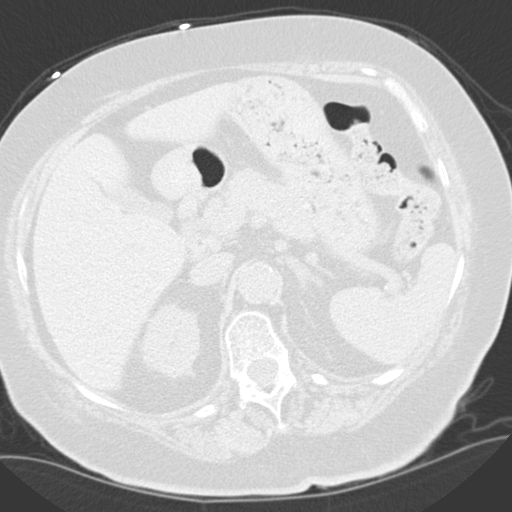
[im 18/94  lung]
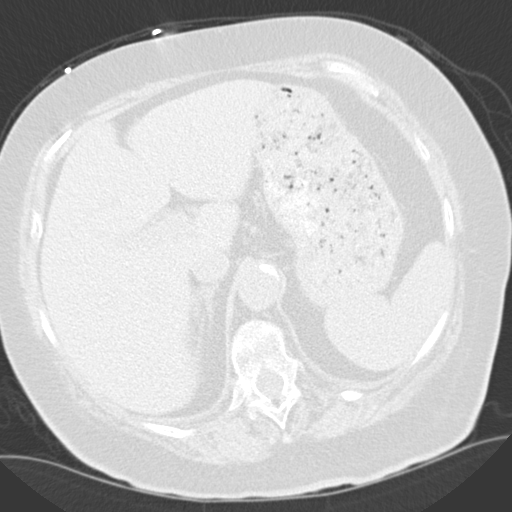
[im 21/94  lung]
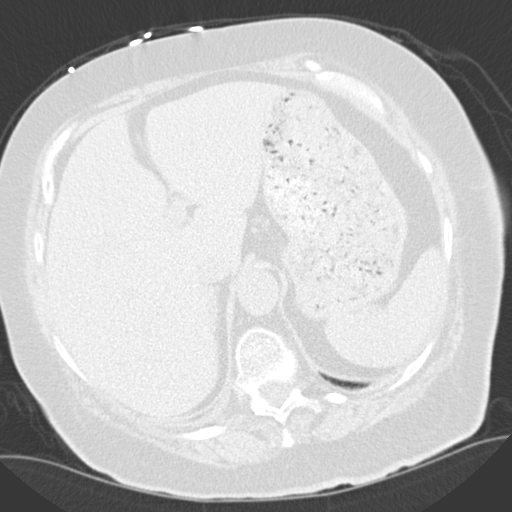
[im 28/94  mediastinal]
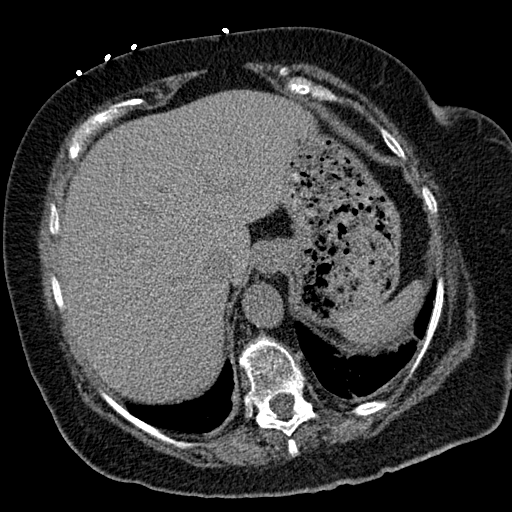
[im 28/94  lung]
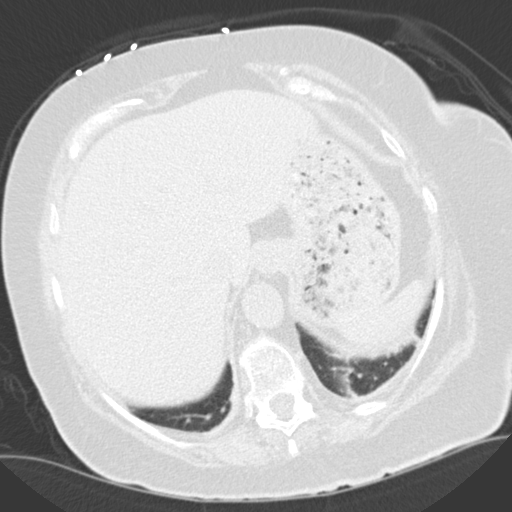
[im 35/94  lung]
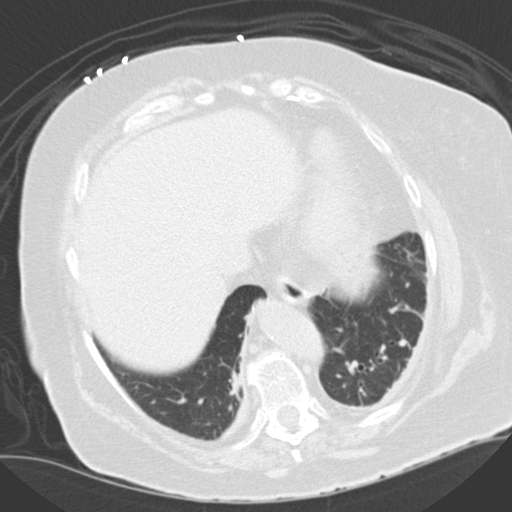
[im 42/94  lung]
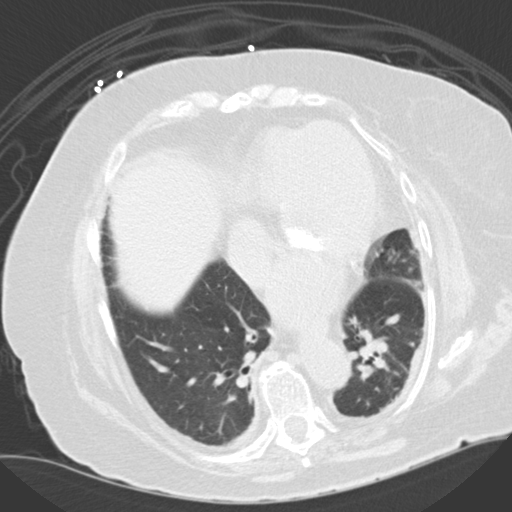
[im 49/94  lung]
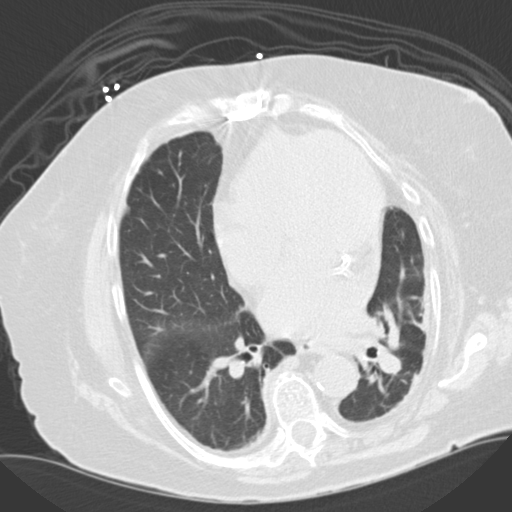
[im 52/94  mediastinal]
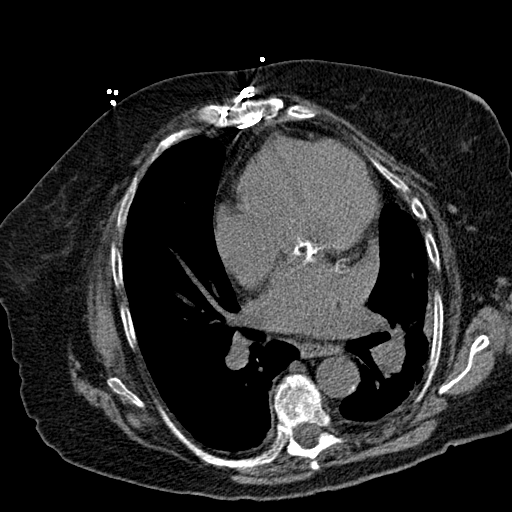
[im 52/94  lung]
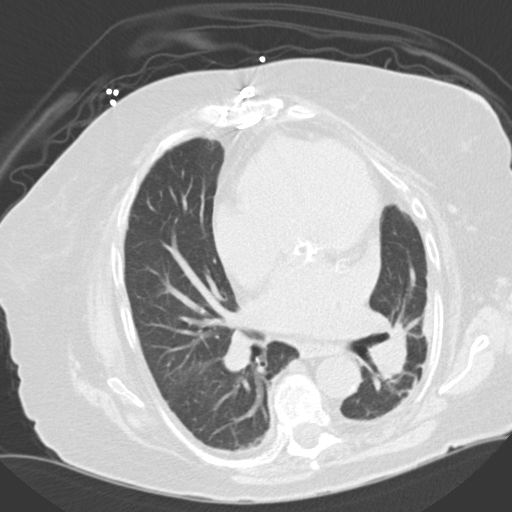
[im 59/94  lung]
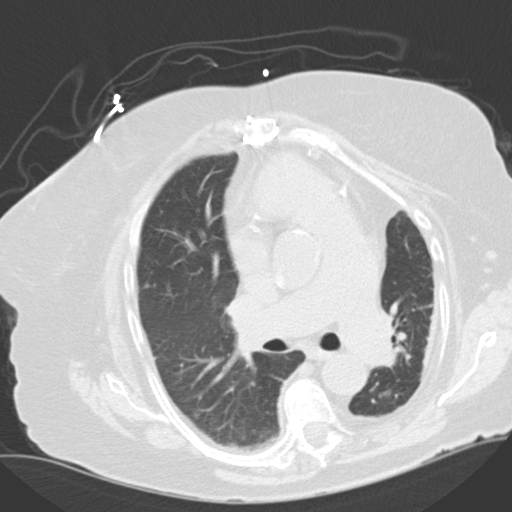
[im 66/94  lung]
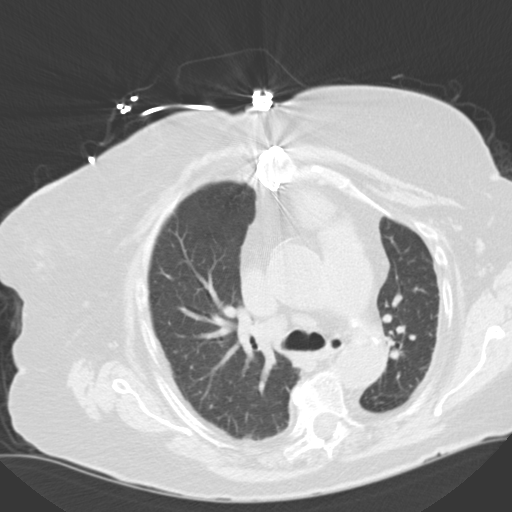
[im 73/94  lung]
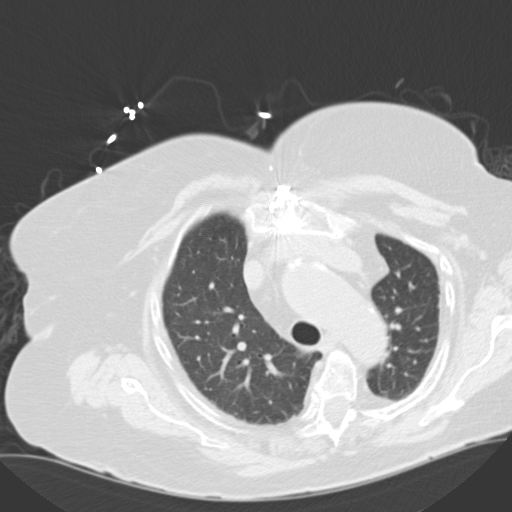
[im 76/94  mediastinal]
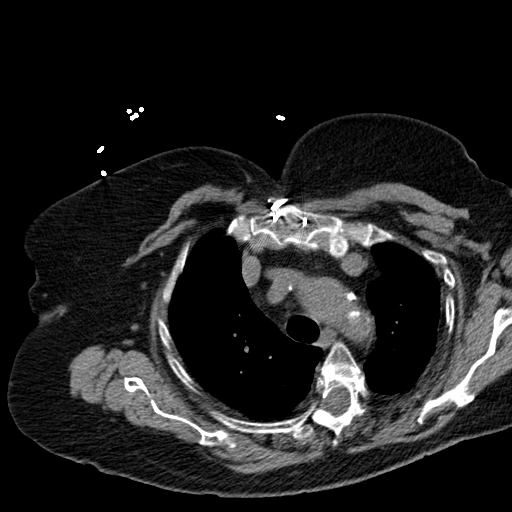
[im 76/94  lung]
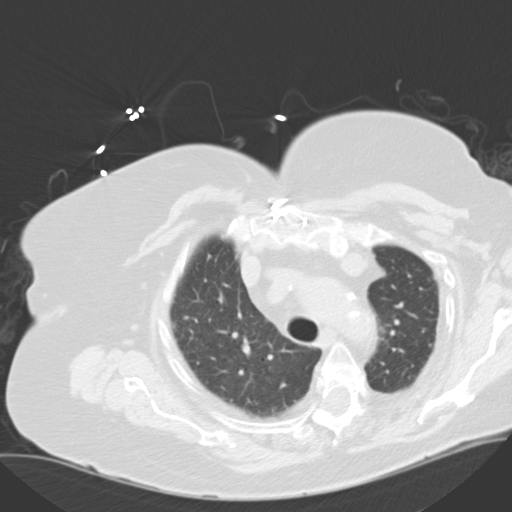
[im 83/94  lung]
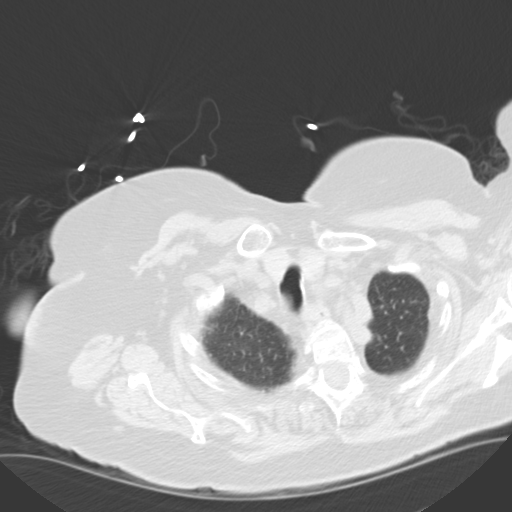
[im 90/94  lung]
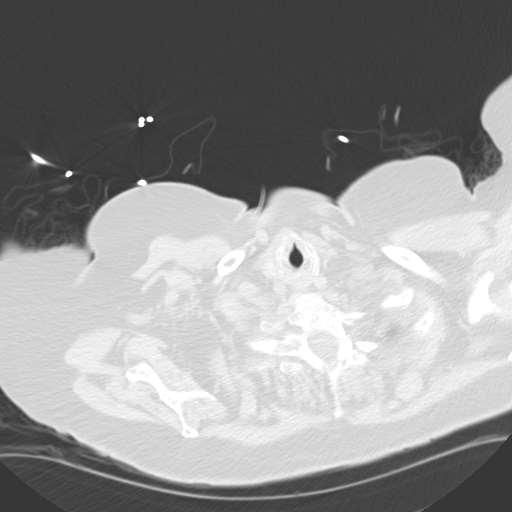

[15 of 31 positions shown; findings below may reference images not displayed]

PROCEDURE:     CT  - CT CHEST WITHOUT CONTRAST  - July 09, 2012  [DATE]

RESULT:     Axial noncontrast CT scanning was performed through the abdomen
and pelvis with reconstructions at 3 mm intervals and slice thicknesses.
Review of multiplanar reconstructed images was performed separately on the
VIA monitor.

At lung window settings there is no alveolar nor interstitial infiltrates.
Patchy increased lung markings are noted in the left lower lobe posteriorly
consistent with subsegmental atelectasis. There are scattered 2 to 4 mm
diameter soft tissue density nodules in both lungs. The largest lies in the
right middle lobe on image 41.

At mediastinal window settings the cardiac chambers are normal in size.
There are coronary artery calcifications. The caliber of the thoracic aorta
is normal. There is no pleural nor pericardial effusion.

Within the upper abdomen a gallstone is present. The stomach is moderately
distended with fluid. The liver exhibits a left lobe hypodensity measuring
1.5 cm in greatest dimension while the right lobe demonstrates an
approximately 0.8 cm diameter hypodensity. These likely reflect cysts. There
are no adrenal masses. The spleen is normal in size where visualized. The
thoracic vertebral bodies are preserved in height.
IMPRESSION: 1. There is no focal pneumonia. There is basilar atelectasis on the left.
There are no findings suspicious for pulmonary fibrosis.
2. There are scattered 2 to 4 mm diameter soft tissue density pulmonary
nodules bilaterally.
3. The cardiac chambers are enlarged but there is no overt evidence of CHF.
4. There is at least one calcified gallstone present.

[REDACTED]

## 2014-06-13 ENCOUNTER — Ambulatory Visit (INDEPENDENT_AMBULATORY_CARE_PROVIDER_SITE_OTHER): Payer: Medicare Other | Admitting: Podiatry

## 2014-06-13 ENCOUNTER — Ambulatory Visit: Payer: Medicare Other | Admitting: Podiatry

## 2014-06-13 DIAGNOSIS — M79676 Pain in unspecified toe(s): Secondary | ICD-10-CM

## 2014-06-13 DIAGNOSIS — B351 Tinea unguium: Secondary | ICD-10-CM

## 2014-06-13 NOTE — Progress Notes (Signed)
She presents today chief complaint of painful elongated toenails.  Objective: Pulses are strongly palpable bilateral. Nails are thick yellow dystrophic with mycotic and painful palpation. Reactive hyperkeratosis sub-first metatarsophalangeal joint of the right foot does not appear to be ulcerated as of yet.  Assessment: Diabetes mellitus pre-ulcerative lesion sub-first metatarsophalangeal joint of the right foot. Pain in limb secondary to onychomycosis 1 through 5 bilateral.  Plan: Debridement of reactive hyperkeratosis without iatrogenic lesions and debridement of nails 1 through 5 bilateral covered service secondary to pain.

## 2014-09-02 NOTE — Discharge Summary (Signed)
PATIENT NAME:  [name], [name] MR#:  [id number] DATE OF BIRTH:  1945-06-06  DATE OF ADMISSION:  07/04/2012 DATE OF DISCHARGE:  07/10/2012  DISCHARGE DIAGNOSES:  1. Congestive heart failure exacerbation, diastolic failure.  2. Acute respiratory failure due to chronic obstructive pulmonary disease and congestive heart failure exacerbation, resolved.  3. Chronic obstructive pulmonary disease exacerbation.  4. Acute renal failure, resolved.  5. Hypertension.  6. Diabetes.   PRIMARY CARE PHYSICIAN: Mickel Crowobert J. Mead, MD at [facility], [facility] [address] PRIMARY CARDIOLOGIST: Dr. Julien Nordmannimothy Gollan at [facility]   HISTORY OF PRESENT ILLNESS: This 69 year old Caucasian female presented to the hospital with complaint of shortness of breath. According to the patient, she was doing quite well until approximately a day ago when she started having increased shortness of breath.  It was all through the night, and she presented to the hospital for further evaluation. She denied any fever or chills but admitted to having some chest pain. She told that she was having some between her shoulders, and it was radiating to her left jaw.  Usually it was coming with ambulation. She also gained approximately 10 pounds in the last 2 weeks. She had some sinus congestion, increasing frequency of urination over the past 1 day. She decided to come to the Emergency Room due to a lot of discomfort, and an x-ray was concerning of pneumonia versus CHF.   HOSPITAL COURSE:  1.  For acute on chronic respiratory failure due to congestive heart failure, she was started on oxygen therapy. She was started on Lasix IV for congestive heart failure, and after 3 to 4 days of treatment she had significant improvement. She was still needing to use 2 to 3 liters of nasal cannula oxygen. Her baseline was 2 liters nasal cannula oxygen at home.  Two days before discharge she was improved a lot, but she was  still wearing oxygen while walking, and so we had to hold  discharge; but then finally she was able to maintain her oxygen saturation while walking also with 2 liters nasal cannula, which was her baseline, and so we decided to discharge her home.  2.  Questionable pneumonia or bronchitis:  I gave her Levaquin p.o. and continued nebulization treatment, and she had significant improvement 3.  Malignant hypertension on presentation which came under control with hydralazine and nitroglycerin topical.  4.  Diabetes mellitus:  We held Januvia and metformin initially but then later on restarted on discharge.  5.  Elevated troponin which was thought to be due to diastolic congestive heart failure.  6.  Hyponatremia likely due to diuretic use, which was corrected later on when we decreased the dose of Lasix.    IMPORTANT LABORATORY AND RADIOLOGICAL DATA:  Chest x-ray on presentation shows findings are consistent with congestive heart failure and pulmonary interstitial edema.  There may be underlying COPD. Echocardiogram showed systolic function with low normal ejection fraction, 50% to 55%, impaired LV relaxation, moderate concentric left ventricular hypertrophy, septal motion consistent with conduction abnormality.  Troponin on presentation was 0.15. Creatinine on presentation was 0.86. BNP was 1137.  Hemoglobin was 9.4, platelet count was 289. Blood culture negative. Urine culture was negative. Troponin 0.13. Creatinine went up to 2.04, and then we had to hold the Lasix, and then it started coming down slowly; it came to 1.44 after 2 days and then 1.26 at the time of discharge.   CONDITION ON DISCHARGE: Stable.   CODE STATUS:  FULL CODE.     DISCHARGE MEDICATIONS: Amitriptyline 50 mg oral tablet 1 tablet orally once a day, cyanocobalamin 50 mcg oral tablet once a day, ergocalciferol 50,000 international units once a week, gabapentin 800 mg 2 times a day, gemfibrozil 600 mg oral tablet 2 times a day,  loratadine 10 mg oral tablet once a day, Reglan 5 mg oral tablet 4 times a day, pantoprazole 40 mg oral delayed-release 2 times a day, (Dictation Anomaly) <<MISSING TEXT> 100 mg oral tablet once a day, ranolazine 500 mg oral tablet extended release 2 times a day, Janumet 500/50 mg oral tablet 2 times a day, triamcinolone ophthalmic eye drops 2 times a day, prednisone 10 mg oral tablet, start with 60 and taper 10 mg daily until finish, aspirin 81 mg once a day, carvedilol 3.125 mg oral tablet 2 times a day, furosemide 20 mg oral tablet once a day, levofloxacin 750 mg oral tablet every other day for 3 days, Spiriva 18 mcg inhalation capsule once a day, Advair Diskus 1 puff inhaled 2 times a day, lisinopril 10 mg oral tablet once a day.   HOME OXYGEN ON DISCHARGE: Yes. Portable tank: Yes, 2 liters nasal cannula oxygen supplementation.  DISCHARGE DIET:  Low sodium, low fat, low cholesterol, carbohydrate-controlled ADA diet. Diet consistency: Regular.   TIMEFRAME TO FOLLOW UP:  In 1 to 2 weeks with cardiologist, Dr. Julien Nordmann, and primary care physician, Dr. Mayer Masker, at [facility] [place].    TOTAL TIME SPENT: 45 minutes.   ____________________________ [name] Lynn Pigeon, MD vgv:cb D: 07/12/2012 21:52:15 ET T: 07/13/2012 11:20:42 ET JOB#: [id number]  cc: [name]. Lynn Pigeon, MD, <Dictator> Mickel Crow, MD Antonieta Iba, MD Altamese Dilling MD ELECTRONICALLY SIGNED 08/10/2012 9:21

## 2014-09-02 NOTE — Consult Note (Signed)
General Aspect 69 year old Caucasian female with h/o CABG 2 years ago?, myomectomy, COPD (25 year smoking hx), mild aortic valve stenosis, chronic diastolic CHF, presenting with SOB. CArdiology was consulted for SOB, CHF sx.   H/o of PNA in 04/2012, long recovery [facility]. Still has not recovered to baseline. "Legs are ok"  she was doing quite well until approximately a day ago when she started having increasing shortness of breath Friday Day. . It got worse all through the night. She denies any fevers or chills, but admits to having some rare chest pains. She told me that she was having some in between her shoulder blades pains as well as anterior chest pains which she describes as achiness, intermittent, and radiating to the left jaw. It usually comes on exertion; however today, she had this at rest as well.  10 pound weight gain in the past 2 weeks. She normally trake lasix 40 mg alternating with 20 mg. She drinks alot of diet coke. She admits to having some sinus congestion. She admits of three-pillow orthopnea, also feeling presyncopal earlier today. Admits to having increasing frequency of urination over the past one day.   Present Illness . SOCIAL HISTORY: Lives at home with her granddaughter, no history of smoking, alcohol or drug abuse.   FAMILY HISTORY: Significant for heart disease in both parents, siblings have cancer. The patient's sister had breast cancer. The patient's brother had lung cancer.   Physical Exam:  GEN well developed, well nourished, no acute distress   HEENT red conjunctivae   NECK supple  No masses   RESP clear BS   CARD Regular rate and rhythm  Murmur  ectopy   Murmur Systolic   Systolic Murmur Out flow   ABD denies tenderness  soft   LYMPH negative neck   EXTR negative edema   SKIN normal to palpation   NEURO motor/sensory function intact   PSYCH alert, A+O to time, place, person, good insight   Review of Systems:   Subjective/Chief Complaint weak, SOB   General: Weakness   Skin: No Complaints   ENT: No Complaints   Eyes: No Complaints   Neck: No Complaints   Respiratory: Short of breath   Cardiovascular: Tightness  Dyspnea   Gastrointestinal: No Complaints   Genitourinary: No Complaints   Vascular: No Complaints   Musculoskeletal: No Complaints   Neurologic: No Complaints   Hematologic: No Complaints   Endocrine: No Complaints   Psychiatric: No Complaints   Review of Systems: All other systems were reviewed and found to be negative   Medications/Allergies Reviewed Medications/Allergies reviewed     Anemia:    Chronic kidney disease:    GERD:    HTN:    Sleep apnea:    Gout:    GI Bleed:    Peripheral Neuropathy:    Hyperlipidemia:    Atrial Fibrillation:    CAD:    fx right shoulder on 01/24/11:    Diabetes: diabetic neuropathy   Cardiomyopathy: hypertrophic obstructive cardiomyopathy   Hypertension:    Diabetes Mellitus, Type II (NIDD):        Admit Diagnosis:   ACUTE RESP FAILURE: Onset Date: 05-Jul-2012, Status: Active, Description: ACUTE RESP FAILURE  Home Medications: Medication Instructions Status  amitriptyline 50 mg oral tablet 1 tab(s) orally once a day (at bedtime) Active  Aspir 81 81 mg oral tablet 1 tab(s) orally once a day Active  carvedilol 12.5 mg oral tablet 1 tab(s) orally 2 times a day  Active  cyanocobalamin 50 mcg oral tablet 1 tab(s) orally once a day Active  ergocalciferol 50,000 intl units oral capsule 1 cap(s) orally once a week x 4 weeks Active  furosemide 40 mg oral tablet 1 tab(s) orally once a day Active  gabapentin 800 mg oral tablet 1  orally 2 times a day Active  gabapentin 800 mg oral tablet 2  orally once a day (at bedtime) Active  gemfibrozil 600 mg oral tablet 1 tab(s) orally 2 times a day Active  loratadine 10 mg oral tablet 1 tab(s) orally once a day, As Needed Active  Reglan 5 mg oral tablet 1 tab(s) orally  4 times a day (before meals and at bedtime) Active  pantoprazole 40 mg oral delayed release tablet 1 tab(s) orally 2 times a day Active  pyridoxine 100 mg oral tablet 1 tab(s) orally once a day Active  ranolazine 500 mg oral tablet, extended release 1 tab(s) orally 2 times a day Active  Janumet 500 mg-50 mg oral tablet 1 tab(s) orally 2 times a day Active  triamcinolone ophthalmic Apply topically to affected area 2 times a day Active   Lab Results:  Hepatic:  23-Feb-14 04:20   Bilirubin, Total 0.6  Alkaline Phosphatase 119  SGPT (ALT) 15  SGOT (AST)  13  Total Protein, Serum 7.4  Albumin, Serum  3.3  Routine Chem:  23-Feb-14 04:20   Glucose, Serum  164  BUN  26  Creatinine (comp) 1.22  Sodium, Serum  134  Potassium, Serum 3.6  Chloride, Serum  95  CO2, Serum 32  Calcium (Total), Serum 9.7  Osmolality (calc) 277  Anion Gap 7 (Result(s) reported on 05 Jul 2012 at 05:48AM.)  Routine Hem:  23-Feb-14 04:20   WBC (CBC) 6.3  RBC (CBC)  3.38  Hemoglobin (CBC)  9.8  Hematocrit (CBC)  29.5  Platelet Count (CBC) 292  MCV 87  MCH 29.2  MCHC 33.3  RDW  17.4  Neutrophil % 67.3  Lymphocyte % 15.5  Monocyte % 11.5  Eosinophil % 4.5  Basophil % 1.2  Neutrophil # 4.2  Lymphocyte # 1.0  Monocyte # 0.7  Eosinophil # 0.3  Basophil # 0.1 (Result(s) reported on 05 Jul 2012 at 05:48AM.)   EKG:  Interpretation EKG shows NSR with rate 86 bpm, RBBB, LAFB   Radiology Results: XRay:    22-Feb-14 06:51, Chest PA and Lateral  Chest PA and Lateral   REASON FOR EXAM:    SOB  COMMENTS:       PROCEDURE: DXR - DXR CHEST PA (OR AP) AND LATERAL  - Jul 04 2012  6:51AM     RESULT: Comparison is made to the study of January 30, 2011.    The lungs are adequately inflated. The interstitial markings are   diffusely increased and are more conspicuous than in the past. The   cardiac silhouette is enlarged. The pulmonary vascularity is indistinct.   Small bilateral pleural effusions layer  posteriorly. The patient has   undergone previous CABG.    IMPRESSION:  The findings are consistent with congestive heart failure   and pulmonary interstitial edema. There may be underlying COPD.   Dictation Site: 5        Verified By: DAVID A. [place], M.D., MD  Cardiology:    23-Feb-14 11:04, Echo Doppler  Echo Doppler   Interpretation Summary    Challenging image quality. Left ventricular systolic function is low   normal. Ejection Fraction = 50-55%. The transmitral spectral Doppler  flow pattern is suggestive of impaired LV relaxation. There is   moderate concentric left ventricular hypertrophy. Septal motion is   consistent with conduction abnormality. Regional wall motion   abnormalities cannot be excluded due to limited visualization. The   right ventricular systolic function is normal. The left atrium is   mildly dilated. Right ventricular systolic pressure is normal. Mild   valvular aortic stenosis.    Procedure:    The study was technically difficult with many images being suboptimal   in quality.    A two-dimensional transthoracic echocardiogram with color flow and   Doppler was performed.    Left Ventricle    Ejection Fraction = 50-55%.    The transmitral spectral Doppler flow pattern is suggestive of   impaired LV relaxation.    Regional wall motion abnormalities cannot be excluded due to limited   visualization.    The left ventricle is normal in size.    There is moderate concentric left ventricular hypertrophy.    Left ventricular systolic function is low normal.    Septal motion is consistent with conduction abnormality.    Right Ventricle    The right ventricle is normal size.    The right ventricular systolic function is normal.    Atria    The left atrium is mildly dilated.    Right atrial size is normal.    There is no Doppler evidence for an atrial septal defect.    Mitral Valve    There is no mitral regurgitation noted.    Calcified mitral  apparatus.    There is no mitral valve stenosis.    Tricuspid Valve    The tricuspid valve is normal.    There is trace tricuspid regurgitation.    Right ventricular systolic pressureis normal.    Aortic Valve    No aortic regurgitation is present.    Mild valvular aortic stenosis.    Moderate sclerosis.    Pulmonic Valve    The pulmonic valve leaflets are thin and pliable; valve motion is   normal.    Trace pulmonic valvular regurgitation.    Great Vessels    The aortic root is normal size.    The pulmonary is not well visualized.    Pericardium/Pleural    There is no pleural effusion.    No pericardial effusion.    Doppler Measurements and Calculations    RAP systole: 5.0 mmHg    Reading Physician: Julien Nordmann   Sonographer: Nestor Ramp RCS  Interpreting Physician:  Julien Nordmann,  electronically signed on   07-05-2012 12:11:20  Requesting Physician: Julien Nordmann    Statins: Other, GI Distress  Vicodin: GI Distress  Oxycodone: GI Distress  Other- Explain in Comments Line: Unknown  Vital Signs/Nurse's Notes: **Vital Signs.:   23-Feb-14 11:54  Vital Signs Type Routine  Temperature Temperature (F) 97.9  Celsius 36.6  Temperature Source oral  Pulse Pulse 82  Respirations Respirations 19  Systolic BP Systolic BP 147  Diastolic BP (mmHg) Diastolic BP (mmHg) 101  Mean BP 116  Pulse Ox % Pulse Ox % 92  Pulse Ox Activity Level  At rest  Oxygen Delivery 3L    Impression 69 year old Caucasian female with h/o CABG 2 years ago?, myomectomy, COPD (25 year smoking hx), mild aortic valve stenosis, chronic diastolic CHF, presenting with SOB. CArdiology was consulted for SOB, CHF sx.  Very high BP on arrival.  1) SOB: Likely multifactorial, including acute on chronic diastolic CHF, mild COPD, mild aortic  valve stenosis, high BP on arrival with 200 systolic at home and on arrival (sx worse given hx of myomectomy) Uncertain if she has been compliant with lasix  40 mg alternating with lasix 20 mg daily. She drinks lots of soda (diet coke) --Would continue lasix IV BID, coreg --Close BP monitoring --At d/c, increase lasix to 40 mg daily, decrease sode intake. would start ACE inhibitor  2) CAD, CABG minimal chest pain cardiac enz stable If she has additional sz as an outpt, would do outpt lexiscan myoview  3)HTN: improved with hydralazine Could add ace as well.  4) Atrial fib hx maintaining NSR, continue coreg BID  5) Elevated cardiac enz: minimal elevation, no significant change Not a primary ischemic event no further testing at this time   Electronic Signatures: Julien Nordmann (MD)  (Signed 23-Feb-14 14:29)  Authored: General Aspect/Present Illness, History and Physical Exam, Review of System, Past Medical History, Health Issues, Home Medications, Labs, EKG , Radiology, Allergies, Vital Signs/Nurse's Notes, Impression/Plan   Last Updated: 23-Feb-14 14:29 by Julien Nordmann (MD)

## 2014-09-02 NOTE — H&P (Signed)
PATIENT NAME:  [name], PATRON MR#:  [id number] DATE OF BIRTH:  1946-03-09  DATE OF ADMISSION:  07/04/2012  PRIMARY CARE PHYSICIAN: DR R. Mead   HISTORY OF PRESENT ILLNESS: The patient is a 69 year old Caucasian female who presented to the hospital on 07/04/2012 with complaints of shortness of breath. According to the patient, she was doing quite well until approximately a day ago when she started having increasing shortness of breath. It got worse all through the night and she presented to the hospital for further evaluation. She denies any fevers or chills, but admits to having some chest pains. She told me that she was having some in between her shoulder blades pains as well as anterior chest pains which she describes as achiness, intermittent, and radiating to the left jaw. It usually comes on exertion; however today, she had this at rest as well. She also admits to having some weight gain, approximately 10 pounds in the past 2 weeks. She feels that she is probably fluid overloaded but did not take any diuretics recently. She admits to having some sinus congestion, mostly in the frontal sinuses, mostly on the left side. Admits of having some wheezing in the Emergency Room also shortness of breath. She admits of three-pillow orthopnea, also feeling presyncopal earlier today. Admits to having increasing frequency of urination over the past one day. Because of this discomfort, she decided to come to the Emergency Room for further evaluation. In the Emergency Room, her chest x-ray was concerning for pneumonia versus CHF and Hospitalist services were contacted for admission.   PAST MEDICAL HISTORY: Significant for a history of hypertension, coronary artery disease, status post coronary artery bypass grafting, diabetes mellitus type 2, peripheral neuropathy with diabetic gastroparesis, hypertrophic obstructive cardiomyopathy, congestive heart failure with diastolic dysfunction, a history of transient ischemic  attacks in the past, also a history of gout.   PAST SURGICAL HISTORY: Coronary artery disease with bypass grafting as well as hysterectomy.   ALLERGIES: oxycodone, statins, vICODIN.   MEDICATIONS: According to medical records, the patient is on amitriptyline 50 mg p.o. daily, aspirin 81 mg p.o. daily, carvedilol 12.5 mg p.o. twice daily;  cyanocobolamide 50 mcg p.o. daily, ergocalciferol 50,000 units once weekly, furosemide 40 mg p.o. daily, gabapentin 800 mg p.o. twice daily and 1600 mg p.o. at bedtime, gemfibrozil 600 mg p.o. twice daily, janumet 500/50, 1 tablet twice a day, loratadine 10 mg p.o. daily as needed, omeprazole 40 mg p.o. twice daily, pyridoxine 1 mg p.o. daily, ranolazine 500 mg p.o. twice a day, reglan 5 mg p.o. 4 times daily before meals and at bedtime and last medication triamcinolone ophthalmic ointment topically to affected eye twice a day.   SOCIAL HISTORY: Lives at home with her granddaughter, no history of smoking, alcohol or drug abuse.   FAMILY HISTORY: Significant for heart disease in both parents, siblings have cancer. The patient's sister had breast cancer. The patient's brother had lung cancer.  REVIEW OF SYSTEMS: Positive as above, anterior as well as posterior chest pains, also significant shortness of breath, weight gain approximately 10 pounds in the past two weeks, some wheezing in the Emergency Room, dyspnea, three-pillow orthopnea, also feeling presyncopal, feeling that she has increase in frequency of urination and difficulty emptying her bladder. Otherwise denies any fevers or chills, fatigue or weakness, weight loss, no gastritis. Blurry vision, double vision, glaucoma, or cataracts. Denies any tinnitus, allergies, epistaxis, sinus pain, dentures.  Respiratory, admits of having some mild cough but no significant sputum production.  No hemoptosis or painful respirations.  CARDIOVASCULAR: Denies any palpitations, however, admits of having some feeling of arrhythmia  earlier today.  GASTROINTESTINAL: Denies vomiting, diarrhea or constipation.  GENITOURINARY: Denies dysuria, hematuria, incontinence, admits of increased frequency of urination, as well as episodes of incontinence Endocrinologic: no heat or cold intolerance or thirst.  HEMATOLOGIC: Denies anemias, easy bruising,  enlarged lymphnodes.  SKIN: Denies any acne, rashes, lesions or change in moles.  MUSCULOSKELETAL: Denies arthritis, cramps, swelling.  NEUROLOGICAL: Admits of having neuropathy and pains in lower back as well as lower extremities as well as arms for which she takes gabapentin, no evidence of tremor.  PSYCHIATRY: Denies anxiety, insomnia or depression.   ON ARRIVAL TO THE HOSPITAL, THE PATIENT'S VITALS: Temperature is 99.7, pulse was 77, respiratory rate was 19, blood pressure 180/73, saturation was 95% on 2 liters of oxygen through nasal cannula.   GENERAL: This is a well-developed, well-nourished obese Caucasian female in mild to moderate distress due to shortness of breath. She is able to sit up on the stretcher. However, she is really short of breath, even whenever she talks brief sentences.  HEENT: Her pupils are equal, extraocular movements intact, no conjunctivitis. Has normal hearing. No pharyngeal erythema. Mucosa is dry. Neck no masses, nontender. Thyroid is not enlarged. No adenopathy. No JVD or carotid bruits bilaterally. Full range of motion.  LUNGS: Diminished breath sounds bilaterally. A few wheezes were heard bilaterally at bases as well as rhonchi and crackles. The patient does have labored inspirations as well as increased effort to breathe. She is in mild respiratory distress.  CARDIOVASCULAR: The patient does have S1, S2 appreciated, a 3/6 systolic murmur was heard in mitral auscultation site radiating to her left axilla and the PMI not lateralized.  CHEST: Nontender to palpation.  EXTREMITIES: 1+ pedal pulses. No lower extremity edema, calf tenderness or cyanosis was  noted.  ABDOMEN: Protuberant, soft, nontender. Bowel sounds are present. No hepatosplenomegaly or masses were noted.  RECTAL: Deferred.  MUSCLE STRENGTH: Able to move all extremities. No cyanosis, degenerative joint disease or kyphosis. Gait is not tested.  SKIN: Skin did not reveal any rashes, lesions, erythema, nodularity or induration. It was warm and dry to palpation.  LYMPH: No adenopathy in the cervical region.  NEUROLOGICAL: Cranial nerves grossly intact. Sensory is intact. No dysarthria or aphasia. The patient is alert, oriented to time, person, and place, cooperative. Memory is good.  PSYCHIATRIC: No significant confusion, agitation or depression noted.   EKG showed sinus rhythm with first-degree AV block, right bundle branch block, left anterior fascicular block, also LVH with QRS widening, cannot rule out septal infarct according to EKG criteria, no acute ST-T changes were noted. As compared to prior EKG done in 09/2011, no significant change since prior EKG was found. In 09/2011, the patient also had Q-waves in the inferior leads, which are not visible during today's admission's EKG.  The patient's lab data done on today on 07/04/2012 showed the glucose of 183. The patient's BNP is 1137, BUN slightly elevated at 20, otherwise BMP was unremarkable. The patient's troponin was elevated at 0.15. Her white cell count was normal at 9.7, hemoglobin was 9.4, platelet count 289. Absolute neutrophil count is also elevated at 7.9. The patient's urinalysis straw, clear urine, negative for glucose, bilirubin or ketones, specific gravity 1.004, pH was 7.0, negative for blood, protein, nitrites, trace leukocyte esterase was noted, less than 1 red blood cell, 2 white blood cells, no bacteria, less than one epithelial cell was  noted.   RADIOLOGIC STUDIES: Chest x-ray:  07/04/2012, showed congestive heart failure and pulmonary interstitial edema as well as possibly underlying COPD, no other significant  changes were noted.   ASSESSMENT AND PLAN:  1. Acute on chronic respiratory failure, likely congestive heart failure-related. However, cannot rule out also underlying pneumonia. We will admit the patient to the medical floor. We will continue oxygen therapy for now and we will try to diurese here as well as give antibiotic therapy and inhalation therapy and we will follow her clinical progression.  2. Congestive heart failure, left heart acute on chronic diastolic. We will continue Lasix IV. We will add also nitroglycerin topically and also add hydralazine p.o.  3. Questionable pneumonia. We will give her Levaquin p.o. as well as Xopenex inhalation therapy. We will also get sputum cultures if possible.  4. Malignant hypertension. On nitroglycerin topically. Hydralazine as well as Coreg.  5. Diabetes mellitus. We will be holding metformin while the patient actively being diuresed, also we will continue sliding scale insulin and diabetic diet.  6. Elevated troponin. Questionable non-Q-wave myocardial infarction, versus mild bump of troponin due to hypoxia. We will get echocardiogram and Cardiology consultation if needed.  7. Diabetic neuropathy. We will continue the patient on her usual outpatient medications.   TIME SPENT: 50 minutes.  ____________________________ Katharina Caperima Kellis Mcadam, MD rv:jm D: 07/04/2012 13:16:14 ET T: 07/04/2012 16:17:14 ET JOB#: [id number]  cc: Katharina Caperima Jaliana Medellin, MD, <Dictator> DR Milus HeightMead  Khalik Pewitt MD ELECTRONICALLY SIGNED 08/12/2012 15:48

## 2014-09-03 NOTE — Discharge Summary (Signed)
PATIENT NAME:  [name], [name] MR#:  [id number] DATE OF BIRTH:  17-Feb-1946  DATE OF ADMISSION:  08/10/2013  DATE OF DISCHARGE:  08/11/2013  PRIMARY CARE PHYSICIAN:  Dr. Bethena MidgetMead  DISCHARGE DIAGNOSES:  1.  Chronic obstructive pulmonary disease exacerbation in chronic respiratory failure.  2.  Demand ischemia.  3.  Coronary artery disease.  4.  Hypertension. 5.  Diabetes.   CODE STATUS: Full code.   CONDITION: Stable.   HOME MEDICATIONS: Please refer to the medication reconciliation list. The patient needs home health with physical therapy and oxygen 2-3 liters by nasal cannula.   DIET: Low-sodium, low-fat, low-cholesterol, ADA diet.   ACTIVITY: As tolerated.   FOLLOWUP CARE: Follow with PCP within 1-2 weeks.   REASON FOR ADMISSION: Chest tightness and cough.   HOSPITAL COURSE:  The patient is a 69 year old Caucasian female with past medical history of hypertension, coronary artery disease, status post CABG, diabetes, COPD on home oxygen, presented to the ED with shortness, dry cough, some chills for 1 week. The patient had wheezing  in the ED, was treated with nebulizer and she was noticed to have elevated troponin. The patient always has above line slightly high, slightly elevated troponin. Troponin level was 0.12-0.15 but was 0.19 on admission date. For detailed history and physical examination, please refer to the admission note dictated by Dr. Elisabeth PigeonVachhani.   LABORATORY DATA:  On admission date showed BNP 8.47, BUN 22, creatinine 1.29, and troponin 0.19.   Chest x-ray consistent with COPD but no pneumonia or congestive heart failure.   1.  Chronic obstructive pulmonary disease exacerbation. On admission, the patient was treated with Solu-Medrol, nebulizer and Levaquin. The patient's Solu-Medrol was discontinued and then changed to p.o. prednisone.  The patient's shortness of breath, cough, have much improved, but she still needs oxygen 2-3 liters by nasal cannula.  2.  Chest tightness  with a history of coronary artery disease. The patient's troponin trended down. Elevated troponin is possibly due to demand ischemia due to her COPD exacerbation. The patient has been treated with aspirin, lisinopril and statin.   DISPOSITION: Patient's symptoms have much improved. Vital signs stable. She is clinically stable and will be discharged to home with home health and home oxygen today. I discussed the patient's discharge plan with the patient and the patient's son, nurse and case manager.   TIME SPENT: About 38 minutes.    ____________________________ Shaune PollackQing Emmalie Haigh, MD qc:tc D: 08/11/2013 15:51:20 ET T: 08/11/2013 19:44:04 ET JOB#: [id number]  cc: Shaune PollackQing Vianna Venezia, MD, <Dictator> Shaune PollackQING Pierrette Scheu MD ELECTRONICALLY SIGNED 08/12/2013 18:04

## 2014-09-03 NOTE — H&P (Signed)
PATIENT NAME:  [name], [name] DATE OF BIRTH:  1945-07-05  DATE OF ADMISSION:  08/09/2013  PR[facility] CARE [facility]IC[facility]   REFERRING EMERGENCY ROOM PHYSICIAN: Dr. Margarita Esparza.   CHIEF COMPLAINT: Chest tightness, cough.   HISTORY OF PRESENT ILLNESS: This is a 69 year old female who has past medical history of hypertension, coronary artery disease, status post CABG, diabetes, COPD, chronic oxygen use 2 liters at home, who has been feeling somewhat more short of breath and cough, which is dry, with some congestion and some chills for the last 1 week. She called her primary care doctor's office, but she could not see her so she decided to go to a walk-in clinic, and they sent her in to hospital for further management. In the ER she was given nebulizer treatment, as she had some wheezing on presentation. That felt a little better after the nebulizer treatment, but then because of the anxiety, she had some complaint of chest tightness. Her troponin, which is always borderline slightly high at 0.12 to 0.15, was noted to be 0.19, but because of this overall presentation (somewhat wheezing, some anxiety, and troponin slightly higher than baseline), ER physician felt that she can be on observation to rule out any other reasons and given to hospitalist service.  REVIEW OF SYSTEMS:   CONSTITUTIONAL: Negative for fever, fatigue, weakness, pain or weight loss.  EYES: No blurring, double vision, discharge or redness.  EARS, NOSE, THROAT: No tinnitus, ear pain or hearing loss.  RESPIRATORY: Has some cough, which is dry, some wheezing.  CARDIOVASCULAR: Had some chest tightness with anxiety, but no palpitations, edema, arrhythmia.  GASTROINTESTINAL: No nausea, vomiting, diarrhea, abdominal pain.  GENITOURINARY: No dysuria, hematuria or increased frequency.  ENDOCRINE: No heat or cold intolerance. No increased sweating.  SKIN: No rashes.  NEUROLOGICAL: No numbness, weakness,  tremor or vertigo.  PSYCHIATRIC: No insomnia, bipolar disorder.   PAST MEDICAL HISTORY: 1.  Hypertension.  2.  Coronary artery disease, status post CABG.  3.  Diabetes mellitus type 2.  4.  Peripheral neuropathy with diabetic gastroparesis.  5.  Hypertrophic obstructive cardiomyopathy.  6.  Congestive heart failure with diastolic dysfunction.  7.  Transient ischemic attack. 8.  Gout.   PAST SURGICAL HISTORY: Coronary artery bypass graft surgery and hysterectomy.   SOCIAL HISTORY: Lives at home with her granddaughter. No history of smoking, alcohol or drug abuse.   FAMILY HISTORY: Significant for heart disease in both parents. Siblings have cancer. Sister had breast cancer. Brother had lung cancer.   MEDICATIONS AT HOME:  1.  Spiriva 18 mcg inhalation once a day.  2.  Reglan 5 mg oral 4 times a day.  3.  Ranolazine 500 mg oral 2 times a day. 4.  Pyridoxine 100 mg oral once a day.  5.  Pantoprazole 40 mg oral 2 times a day.  6.  Loratadine 10 mg orally once a day.  7.  Lisinopril 10 mg oral tablet once a day. 8.  Janumet 500/50 mg 2 times a day.  9.  Gemfibrozil 600 mg oral 2 times a day.  10.  Gabapentin 800 mg oral 2 times a day. 11.  Furosemide 20 mg oral once a day.  12.  Ergocalciferol 50,000 international units 2 times a day.  13.  Cyanocobalamin 50 mcg oral once a day.  14.  Carvedilol 3.125 mg oral 2 times a day.  15.  Carbidopa/levodopa 25/100 mg 3 times a day.  16.  Aspirin  81 mg once a day.  17.  Amitriptyline 50 mg oral once a day.  18.  Advair Diskus 1 puff inhalation 2 times a day.   PHYSICAL EXAMINATION: VITAL SIGNS: In ER, temperature 98.2, pulse 76, respirations 20, blood pressure 120/64, on presentation and 93% on oxygen supplementation 2 to 3 liters later on.  GENERAL: The patient is fully alert and oriented to time, place, and person and does not appear in any distress. HEENT: Head and neck atraumatic. Conjunctivae pink. Oral mucosa moist.  NECK: Supple.  No JVD.  RESPIRATORY: Bilateral equal air entry. Slight wheezing present.  CARDIOVASCULAR: S1, S2 present, regular. No murmur.  ABDOMEN: Soft, nontender. Bowel sounds present. No organomegaly.  SKIN: No rashes.  EXTREMITIES: Legs: No edema.  NEUROLOGICAL: Power 5/5. Follows commands. Moves all 4 limbs. No tremor. PSYCHIATRIC: Does not appear in any acute psychiatric illness at this time.   IMPORTANT LABORATORY RESULTS: BNP is 847. Glucose is 102, BUN 22, creatinine 1.29, sodium 134, potassium 3.8, chloride 101 and CO2 of 31. Calcium level is 9.1. Total protein 7.6, albumin 3.4, bilirubin 0.4, alkaline phosphatase 160, SGOT 23 and SGPT 17. Troponin level 0.19. WBC 4.1, hemoglobin 10.1 and platelet count 259; MCV is 85.   IMAGING: Chest x-ray, PA and lateral: Mild hyperinflation consistent with COPD. Stable increased interstitial markings are present bilaterally. No focal pneumonia. No evidence of CHF. Cannot exclude acute bronchitis in appropriate clinical setting.   ASSESSMENT AND PLAN: This is a 69 year old female with past medical history of coronary artery disease, coronary artery bypass grafting, CABG, chronic obstructive pulmonary disease, diabetes, peripheral neuropathy, hypertension, presented to Emergency Room, sent from a walk-in clinic because of shortness, some cough, and had some chest tightness. Troponin is slightly elevated.  1.  For chest pain, troponin is slightly elevated. We will continue her cardiac medications and will do serial troponin. Most likely this was due to was stress of her chronic obstructive pulmonary disease exacerbation and bronchitis.  2.  Chronic obstructive pulmonary disease exacerbation: Likely this is bronchitis. She had somewhat improvement after receiving nebulizer in ER. We will continue her nebs and will give  IV steroid for one day. She does not have extensive severe wheezing, so hopefully she will be able to recover within just one day. She is already using  home oxygen 2 liters.  3.  Diabetes: Continue home medication and insulin sliding scale coverage.  4.  Neuropathy: Continue gabapentin.  5.  History of coronary artery disease, status post coronary artery bypass grafting: Continue aspirin, carvedilol, and statin.  CODE STATUS: Full code.   TOTAL TIME SPENT ON THIS ADMISSION: 45 minutes.    ____________________________ Hope Pigeon Elisabeth Pigeon, MD vgv:jcm D: 08/09/2013 17:16:28 ET T: 08/09/2013 19:30:51 ET JOB#: [id number]  cc: Hope Pigeon. Elisabeth Pigeon, MD, <Dictator> Altamese Dilling MD ELECTRONICALLY SIGNED 08/10/2013 18:31

## 2014-09-12 ENCOUNTER — Ambulatory Visit: Payer: Medicare Other

## 2017-05-13 DEATH — deceased
